# Patient Record
Sex: Male | Born: 1960 | Race: White | Hispanic: No | Marital: Single | Smoking: Never smoker
Health system: Southern US, Community
[De-identification: ages and names within clinical notes are randomized; demographics above are authoritative.]

## PROBLEM LIST (undated history)

## (undated) DIAGNOSIS — M1990 Unspecified osteoarthritis, unspecified site: Secondary | ICD-10-CM

## (undated) DIAGNOSIS — T4145XA Adverse effect of unspecified anesthetic, initial encounter: Secondary | ICD-10-CM

## (undated) DIAGNOSIS — F329 Major depressive disorder, single episode, unspecified: Secondary | ICD-10-CM

## (undated) DIAGNOSIS — I10 Essential (primary) hypertension: Secondary | ICD-10-CM

## (undated) DIAGNOSIS — G2581 Restless legs syndrome: Secondary | ICD-10-CM

## (undated) DIAGNOSIS — K219 Gastro-esophageal reflux disease without esophagitis: Secondary | ICD-10-CM

## (undated) DIAGNOSIS — Z9889 Other specified postprocedural states: Secondary | ICD-10-CM

## (undated) DIAGNOSIS — R569 Unspecified convulsions: Secondary | ICD-10-CM

## (undated) DIAGNOSIS — F410 Panic disorder [episodic paroxysmal anxiety] without agoraphobia: Secondary | ICD-10-CM

## (undated) DIAGNOSIS — E785 Hyperlipidemia, unspecified: Secondary | ICD-10-CM

## (undated) DIAGNOSIS — F32A Depression, unspecified: Secondary | ICD-10-CM

## (undated) DIAGNOSIS — R112 Nausea with vomiting, unspecified: Secondary | ICD-10-CM

## (undated) DIAGNOSIS — T8859XA Other complications of anesthesia, initial encounter: Secondary | ICD-10-CM

## (undated) DIAGNOSIS — R7989 Other specified abnormal findings of blood chemistry: Secondary | ICD-10-CM

## (undated) HISTORY — PX: HERNIA REPAIR: SHX51

## (undated) HISTORY — DX: Major depressive disorder, single episode, unspecified: F32.9

## (undated) HISTORY — DX: Hyperlipidemia, unspecified: E78.5

## (undated) HISTORY — PX: HIP PINNING: SHX1757

## (undated) HISTORY — DX: Other specified abnormal findings of blood chemistry: R79.89

## (undated) HISTORY — PX: JOINT REPLACEMENT: SHX530

## (undated) HISTORY — DX: Depression, unspecified: F32.A

## (undated) HISTORY — PX: COLONOSCOPY: SHX174

## (undated) HISTORY — DX: Essential (primary) hypertension: I10

## (undated) HISTORY — DX: Panic disorder (episodic paroxysmal anxiety): F41.0

## (undated) HISTORY — PX: TONSILLECTOMY: SUR1361

---

## 1998-02-18 HISTORY — PX: KNEE ARTHROSCOPY: SUR90

## 2004-02-19 HISTORY — PX: KNEE ARTHROSCOPY W/ ACL RECONSTRUCTION: SHX1858

## 2007-01-29 ENCOUNTER — Ambulatory Visit: Payer: Self-pay | Admitting: Unknown Physician Specialty

## 2007-08-03 ENCOUNTER — Ambulatory Visit: Payer: Self-pay | Admitting: Gastroenterology

## 2008-11-14 ENCOUNTER — Encounter: Admission: RE | Admit: 2008-11-14 | Discharge: 2008-11-14 | Payer: Self-pay | Admitting: Sports Medicine

## 2008-11-18 ENCOUNTER — Encounter: Admission: RE | Admit: 2008-11-18 | Discharge: 2008-11-18 | Payer: Self-pay | Admitting: Sports Medicine

## 2009-01-27 ENCOUNTER — Encounter: Admission: RE | Admit: 2009-01-27 | Discharge: 2009-01-27 | Payer: Self-pay | Admitting: Sports Medicine

## 2009-10-03 ENCOUNTER — Ambulatory Visit: Payer: Self-pay | Admitting: Internal Medicine

## 2010-09-28 ENCOUNTER — Encounter (HOSPITAL_COMMUNITY)
Admission: RE | Admit: 2010-09-28 | Discharge: 2010-09-28 | Disposition: A | Discharge status: Home / Self Care | Payer: BC Managed Care – PPO | Source: Ambulatory Visit | Attending: Orthopedic Surgery | Admitting: Orthopedic Surgery

## 2010-09-28 ENCOUNTER — Other Ambulatory Visit (HOSPITAL_COMMUNITY): Payer: Self-pay | Admitting: Orthopedic Surgery

## 2010-09-28 DIAGNOSIS — M1990 Unspecified osteoarthritis, unspecified site: Secondary | ICD-10-CM

## 2010-09-28 LAB — URINALYSIS, ROUTINE W REFLEX MICROSCOPIC
Glucose, UA: NEGATIVE mg/dL
Hgb urine dipstick: NEGATIVE
Protein, ur: NEGATIVE mg/dL
Urobilinogen, UA: 0.2 mg/dL (ref 0.0–1.0)
pH: 5 (ref 5.0–8.0)

## 2010-09-28 LAB — COMPREHENSIVE METABOLIC PANEL
ALT: 42 U/L (ref 0–53)
Albumin: 4.4 g/dL (ref 3.5–5.2)
Alkaline Phosphatase: 104 U/L (ref 39–117)
Calcium: 10.8 mg/dL — ABNORMAL HIGH (ref 8.4–10.5)
GFR calc Af Amer: >60 mL/min (ref >60)

## 2010-09-28 LAB — APTT: aPTT: 30 seconds (ref 24–37)

## 2010-09-28 LAB — CBC
HCT: 42.5 % (ref 39.0–52.0)
MCH: 30.6 pg (ref 26.0–34.0)
MCV: 85.7 fL (ref 78.0–100.0)
Platelets: 268 10*3/uL (ref 150–400)
RBC: 4.96 MIL/uL (ref 4.22–5.81)
RDW: 12.2 % (ref 11.5–15.5)

## 2010-09-28 LAB — SURGICAL PCR SCREEN
MRSA, PCR: NEGATIVE
Staphylococcus aureus: NEGATIVE

## 2010-10-03 ENCOUNTER — Inpatient Hospital Stay (HOSPITAL_COMMUNITY): Payer: BC Managed Care – PPO

## 2010-10-03 ENCOUNTER — Inpatient Hospital Stay (HOSPITAL_COMMUNITY)
Admission: RE | Admit: 2010-10-03 | Discharge: 2010-10-06 | DRG: 818 | Disposition: A | Discharge status: Home / Self Care | Payer: BC Managed Care – PPO | Source: Ambulatory Visit | Attending: Orthopedic Surgery | Admitting: Orthopedic Surgery

## 2010-10-03 DIAGNOSIS — E785 Hyperlipidemia, unspecified: Secondary | ICD-10-CM | POA: Diagnosis present

## 2010-10-03 DIAGNOSIS — K560 Paralytic ileus: Secondary | ICD-10-CM | POA: Diagnosis not present

## 2010-10-03 DIAGNOSIS — E119 Type 2 diabetes mellitus without complications: Secondary | ICD-10-CM | POA: Diagnosis present

## 2010-10-03 DIAGNOSIS — K219 Gastro-esophageal reflux disease without esophagitis: Secondary | ICD-10-CM | POA: Diagnosis present

## 2010-10-03 DIAGNOSIS — K929 Disease of digestive system, unspecified: Secondary | ICD-10-CM | POA: Diagnosis not present

## 2010-10-03 DIAGNOSIS — K429 Umbilical hernia without obstruction or gangrene: Secondary | ICD-10-CM | POA: Diagnosis present

## 2010-10-03 DIAGNOSIS — M1610 Unilateral primary osteoarthritis, unspecified hip: Principal | ICD-10-CM | POA: Diagnosis present

## 2010-10-03 DIAGNOSIS — I10 Essential (primary) hypertension: Secondary | ICD-10-CM | POA: Diagnosis present

## 2010-10-03 DIAGNOSIS — Z01812 Encounter for preprocedural laboratory examination: Secondary | ICD-10-CM

## 2010-10-03 DIAGNOSIS — D62 Acute posthemorrhagic anemia: Secondary | ICD-10-CM | POA: Diagnosis not present

## 2010-10-03 DIAGNOSIS — M51379 Other intervertebral disc degeneration, lumbosacral region without mention of lumbar back pain or lower extremity pain: Secondary | ICD-10-CM | POA: Diagnosis present

## 2010-10-03 DIAGNOSIS — Y831 Surgical operation with implant of artificial internal device as the cause of abnormal reaction of the patient, or of later complication, without mention of misadventure at the time of the procedure: Secondary | ICD-10-CM | POA: Diagnosis not present

## 2010-10-03 DIAGNOSIS — F411 Generalized anxiety disorder: Secondary | ICD-10-CM | POA: Diagnosis present

## 2010-10-03 DIAGNOSIS — M169 Osteoarthritis of hip, unspecified: Principal | ICD-10-CM | POA: Diagnosis present

## 2010-10-03 DIAGNOSIS — E871 Hypo-osmolality and hyponatremia: Secondary | ICD-10-CM | POA: Diagnosis not present

## 2010-10-03 DIAGNOSIS — Z01818 Encounter for other preprocedural examination: Secondary | ICD-10-CM

## 2010-10-03 DIAGNOSIS — M5137 Other intervertebral disc degeneration, lumbosacral region: Secondary | ICD-10-CM | POA: Diagnosis present

## 2010-10-03 DIAGNOSIS — E669 Obesity, unspecified: Secondary | ICD-10-CM | POA: Diagnosis present

## 2010-10-03 HISTORY — PX: TOTAL HIP ARTHROPLASTY: SHX124

## 2010-10-03 LAB — ABO/RH: ABO/RH(D): A POS

## 2010-10-03 LAB — GLUCOSE, CAPILLARY
Glucose-Capillary: 180 mg/dL — ABNORMAL HIGH (ref 70–99)
Glucose-Capillary: 208 mg/dL — ABNORMAL HIGH (ref 70–99)
Glucose-Capillary: 229 mg/dL — ABNORMAL HIGH (ref 70–99)

## 2010-10-04 LAB — BASIC METABOLIC PANEL
BUN: 14 mg/dL (ref 6–23)
CO2: 26 mEq/L (ref 19–32)
CO2: 26 mEq/L (ref 19–32)
Calcium: 8.6 mg/dL (ref 8.4–10.5)
Calcium: 8.6 mg/dL (ref 8.4–10.5)
Chloride: 94 mEq/L — ABNORMAL LOW (ref 96–112)
Creatinine, Ser: 0.87 mg/dL (ref 0.50–1.35)
Glucose, Bld: 212 mg/dL — ABNORMAL HIGH (ref 70–99)
Potassium: 4.1 mEq/L (ref 3.5–5.1)
Potassium: 4.1 mEq/L (ref 3.5–5.1)

## 2010-10-04 LAB — CBC
HCT: 34.4 % — ABNORMAL LOW (ref 39.0–52.0)
Platelets: 219 10*3/uL (ref 150–400)
RDW: 12.2 % (ref 11.5–15.5)

## 2010-10-04 LAB — PROTIME-INR
INR: 1.09 (ref 0.00–1.49)
Prothrombin Time: 14.3 seconds (ref 11.6–15.2)

## 2010-10-04 LAB — GLUCOSE, CAPILLARY

## 2010-10-05 ENCOUNTER — Inpatient Hospital Stay (HOSPITAL_COMMUNITY): Payer: BC Managed Care – PPO

## 2010-10-05 LAB — BASIC METABOLIC PANEL
CO2: 31 mEq/L (ref 19–32)
Calcium: 8.9 mg/dL (ref 8.4–10.5)
GFR calc non Af Amer: >60 mL/min (ref >60)
Glucose, Bld: 198 mg/dL — ABNORMAL HIGH (ref 70–99)
Potassium: 3.8 mEq/L (ref 3.5–5.1)
Sodium: 130 mEq/L — ABNORMAL LOW (ref 135–145)

## 2010-10-05 LAB — GLUCOSE, CAPILLARY
Glucose-Capillary: 202 mg/dL — ABNORMAL HIGH (ref 70–99)
Glucose-Capillary: 93 mg/dL (ref 70–99)
Glucose-Capillary: 125 mg/dL — ABNORMAL HIGH (ref 70–99)

## 2010-10-05 LAB — VITAMIN B12: Vitamin B-12: 323 pg/mL (ref 211–911)

## 2010-10-05 LAB — CBC
Hemoglobin: 10.6 g/dL — ABNORMAL LOW (ref 13.0–17.0)
MCH: 29.8 pg (ref 26.0–34.0)
MCHC: 35 g/dL (ref 30.0–36.0)
Platelets: 190 10*3/uL (ref 150–400)

## 2010-10-05 LAB — CREATININE, URINE, RANDOM: Creatinine, Urine: 137.04 mg/dL

## 2010-10-05 LAB — IRON AND TIBC
Saturation Ratios: 6 % — ABNORMAL LOW (ref 20–55)
UIBC: 216 ug/dL

## 2010-10-05 LAB — PROTIME-INR: INR: 1.33 (ref 0.00–1.49)

## 2010-10-05 LAB — FERRITIN: Ferritin: 496 ng/mL — ABNORMAL HIGH (ref 22–322)

## 2010-10-05 LAB — OSMOLALITY: Osmolality: 265 mOsm/kg — ABNORMAL LOW (ref 275–300)

## 2010-10-06 ENCOUNTER — Inpatient Hospital Stay (HOSPITAL_COMMUNITY): Payer: BC Managed Care – PPO

## 2010-10-06 LAB — BASIC METABOLIC PANEL
CO2: 25 mEq/L (ref 19–32)
Calcium: 8.4 mg/dL (ref 8.4–10.5)
Chloride: 96 mEq/L (ref 96–112)
Potassium: 4 mEq/L (ref 3.5–5.1)
Sodium: 131 mEq/L — ABNORMAL LOW (ref 135–145)

## 2010-10-06 LAB — DIFFERENTIAL
Basophils Absolute: 0.1 10*3/uL (ref 0.0–0.1)
Lymphocytes Relative: 17 % (ref 12–46)
Lymphs Abs: 1.2 10*3/uL (ref 0.7–4.0)
Neutro Abs: 5.2 10*3/uL (ref 1.7–7.7)

## 2010-10-06 LAB — CBC
HCT: 28.7 % — ABNORMAL LOW (ref 39.0–52.0)
Hemoglobin: 10 g/dL — ABNORMAL LOW (ref 13.0–17.0)
RBC: 3.39 MIL/uL — ABNORMAL LOW (ref 4.22–5.81)
WBC: 7.2 10*3/uL (ref 4.0–10.5)

## 2010-10-06 LAB — HEMOGLOBIN A1C
Hgb A1c MFr Bld: 7.4 % — ABNORMAL HIGH (ref <5.7)
Mean Plasma Glucose: 166 mg/dL — ABNORMAL HIGH (ref <117)

## 2010-10-06 LAB — GLUCOSE, CAPILLARY: Glucose-Capillary: 82 mg/dL (ref 70–99)

## 2010-10-06 LAB — MAGNESIUM: Magnesium: 2.3 mg/dL (ref 1.5–2.5)

## 2010-10-06 LAB — PROTIME-INR: INR: 1.44 (ref 0.00–1.49)

## 2010-10-08 LAB — GLUCOSE, CAPILLARY: Glucose-Capillary: 279 mg/dL — ABNORMAL HIGH (ref 70–99)

## 2010-10-11 NOTE — Op Note (Signed)
NAMEASHELY, JOSHUA NO.:  [id number]  MEDICAL RECORD NO.:  [id number]  LOCATION:                                 FACILITY:  PHYSICIAN:  Loreta Ave, M.D. 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  Status post pinning and pin removal with residual deformity.  POSTOPERATIVE DIAGNOSES:  End-stage degenerative arthritis, left hip. Previous diagnosis of slipped capital femoral epiphysis.  Status post pinning and pin removal with residual deformity.  PROCEDURE:  Conversion of left hip to total hip replacement utilizing Stryker secure fit plus prosthesis.  A 56-mm acetabular component screw fixation x2.  A 40 mm internal diameter ceramic liner.  A Press-Fit #10 femoral component secure fit plus 127 degrees neck angle.  A 40-mm Biolox lock head -2.5 mm.  SURGEON:  Loreta Ave, MD  ASSISTANT:  Zonia Kief, PA present throughout the entire case and necessary for timely completion of procedure.  ANESTHESIA:  General.  BLOOD LOSS:  400 mL.  BLOOD GIVEN:  None.  SPECIMENS:  None.  CULTURES:  None.  COMPLICATIONS:  None.  DRESSING:  Soft compressive with abduction pillow.  PROCEDURE:  The patient was brought to the operating room and after adequate anesthesia had been obtained, turned to a lateral position. Central obesity as well as obesity around the pelvis made positioning and supporting more difficult.  Able to get him to securely position and a lateral position but it took a fair amount of pressure from the upright support to either side.  Prepped and draped in usual sterile fashion.  Incision along the shaft of femur extending posterosuperior. Ignoring the more anterior previous incision.  Skin and subcutaneous tissues divided.   The iliotibial band exposed, incised.  Charnley retractor put in place.  Moderate amount of scarring freed up. Neurovascular structures identified, protected.  External rotator and capsule taken down off the back of the intertrochanteric groove.  Hip dislocated posteriorly.  Marked flattening with inferior and posterior displacement from his previous slipped epiphysis.  Femoral neck cut one fingerbreadth above the lesser trochanter.  Acetabulum exposed.  Grade IV changes.  Remnants of labrum.  Debris cleared out.  Brought to good bleeding bone.  Fitted with a 56-mm cup.  A 45 degrees of abduction, 20 degrees of anteversion.  Good capturing and fixation, augmented with 2 screws, pre drilled through the cup.  A 40-mm internal diameter polyethylene liner inserted.  Attention turned to the femur.  Distal reamers, proximal broaches brought up to good sizing fitting for #10 component which was firmly seated after appropriate trials.  With the 40 mm Biolox lock head, restoration of anteversion as much as possible and utilizing the head with a 2.5 mm shortening, I had a nice congruent reduction.  Equal leg lengths.  Good stability in flexion/extension. Wound irrigated.  External rotator and capsule which had been tagged with FiberWire were repaired with the back of the intertrochanteric groove through drill holes tied over bony bridge.  Wound irrigated.  Charnley retractor removed.  Iliotibial band closed with Vicryl.  Skin and subcutaneous tissue with Vicryl and staples.  Margins were injected with Marcaine.  Sterile compressive dressing applied.  Returned to supine position.  Abduction pillow placed.  Anesthesia reversed. Brought to recovery room.  Tolerated surgery well.  No complications.     Loreta Ave, M.D.     DFM/MEDQ  D:  10/04/2010  T:  10/04/2010  Job:  [id number]  Electronically Signed by Mckinley Jewel M.D. on 10/11/2010 04:15:10 PM

## 2010-10-25 NOTE — Consult Note (Signed)
Donald Velez, Donald Velez NO.:  [id number]  MEDICAL RECORD NO.:  [id number]  LOCATION:  5041                         FACILITY:  [facility]  PHYSICIAN:  Ramiro Harvest, MD    DATE OF BIRTH:  Apr 04, 1960  DATE OF CONSULTATION:  10/05/2010 DATE OF DISCHARGE:                                CONSULTATION   PRIMARY CARE PHYSICIAN:  Bethann Punches, MD in [place] at the [facility].  REQUESTING CONSULT:  Loreta Ave, M.D.  REASON FOR CONSULTATION:  Colonic ileus.  HISTORY OF PRESENT ILLNESS:  Donald Velez is a 50 year old Caucasian gentleman with history of type 2 diabetes, hypertension, depression, anxiety, end-stage degenerative joint disease of the left hip who was admitted to the orthopedic service on October 03, 2010 for left total hip replacement.  The patient did fine postop and was started on a morphine PCA pump.  Since admission, the patient has not had any bowel movements, developed some abdominal bloating.  X-rays which were done was consistent with a colonic ileus.  We were called to consult for further evaluation and management.  The patient denies any fever, no chills.  No nausea. No vomiting.  No diarrhea.  No constipation.  No chest pain.  No shortness of breath.  No cough.  No weakness.  The patient however does endorse some abdominal soreness with some bloating.  The patient states he has passed some flatus today and is tolerating p.o.'s  ALLERGIES:  No known drug allergies.  PAST MEDICAL HISTORY: 1. Type 2 diabetes greater than 1 year. 2. Hypertension. 3. Depression. 4. Anxiety. 5. End-stage degenerative joint disease of the left hip. 6. Umbilical hernia. 7. Gastroesophageal reflux disease. 8. Questionable hyperlipidemia. 9. Lumbar degenerative disk disease.  HOME MEDICATIONS:  The patient was on prior to admission include, 1. Cymbalta 60 mg p.o. daily. 2. Xanax 1 mg p.o. q.a.m. 3. Lisinopril and hydrochlorothiazide 10/12.5 two tablets  p.o. daily. 4. Metformin 500 mg p.o. b.i.d. 5. Glipizide 10 mg p.o. daily  CURRENT MEDICATIONS IN-HOUSE: 1. Xanax 1 mg p.o. daily. 2. Colace 100 mg p.o. b.i.d. 3. Cymbalta 60 mg p.o. daily. 4. Lovenox 30 mg subcu q.12 h. 5. Glipizide 10 mg p.o. daily. 6. Hydrochlorothiazide 25 mg p.o. daily. 7. Sliding scale insulin. 8. Lisinopril 20 mg p.o. daily. 9. Glucophage 500 mg p.o. b.i.d. 10.Protonix 80 mg p.o. daily. 11.Coumadin per pharmacy.  SOCIAL HISTORY:  The patient denies any tobacco use.  No alcohol use. No IV drug use.  The patient is single.  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  The patient has no children and the patient is single.  FAMILY HISTORY:  Mother alive at age 29, healthy.  Father deceased at age 35 from prostate cancer.  REVIEW OF SYSTEMS:  As per HPI, otherwise negative.  PHYSICAL EXAMINATION:  VITAL SIGNS:  Temperature 98.1, pulse of 104, blood pressure 94/59, respirations 16, satting 92% on room air.  CBG is 93-232. GENERAL:  The patient is well-developed, well-nourished gentleman in no acute cardiopulmonary distress. HEENT:  Normocephalic, atraumatic.  Pupils are equal, round, reactive to light and accommodation.  Extraocular movements are intact.  Oropharynx is clear.  No lesions.  No exudates. NECK:  Supple.  No lymphadenopathy.  Dry mucous membranes. RESPIRATORY:  Lungs are clear to auscultation bilaterally.  No wheezes. No crackles.  No rhonchi. CARDIOVASCULAR:  Regular rate and rhythm.  No murmurs, rubs, or gallops. ABDOMEN:  Distended, slightly tight, positive bowel sounds.  Nontender to palpation. EXTREMITIES:  No clubbing, cyanosis, or edema.  LABORATORY DATA:  BMET, sodium 130, potassium 3.8, chloride 93, bicarb 31, glucose 198, BUN 13, creatinine 0.95, calcium of 8.9, PT of 16.7, INR 1.33.  CBC with a white count of 9.4, hemoglobin 10.6, hematocrit 30.3, and a platelet count of 190.  Abdominal series shows a  colonic ileus.  ASSESSMENT AND PLAN:  Mr. Donald Velez is a 50 year old gentleman with history of hypertension, diabetes, gastroesophageal reflux disease, questionable hyperlipidemia, end-stage degenerative joint disease of the left hip who was admitted on October 03, 2010 secondary to failed conservative measures for left total hip replacement and found to have a colonic ileus postop.  PROBLEM: 1. Colonic ileus questionable etiology likely narcotic induced as the     patient was on a morphine PCA pump postsurgery in the setting of     dehydration.  We will place the patient on clear liquid diet.  We     will check a magnesium level.  We will follow up his electrolytes,     agree with Colace twice daily.  We will recommend Dulcolax once     daily, try to keep magnesium greater than 2, try to keep potassium     between 4.5 and 5.  Hydrate with IV fluids, serial x-rays,     mobilize, and minimize narcotics.  We will follow with you. 2. Borderline blood pressure.  We will hold BP medications for now, IV     fluids. 3. Hyponatremia likely secondary to volume depletion.  Check a FENA,     check a urine and serum osmolality, hydrate with IV fluids and     follow. 4. Type 2 diabetes, check a hemoglobin A1c.  CBGs have ranged from 93-     232.  Recommend holding oral hypoglycemics while inhouse and     continue sliding scale insulin. 5. Dehydration, IV fluids. 6. Anemia, likely secondary to acute blood loss anemia secondary to     recent surgery.  Check an anemia panel, follow H and H. 7. Status post left total hip replacement per primary team. 8. Depression/anxiety.  Continue Cymbalta and Xanax. 9. Hypertension.  Hold blood pressure medications secondary to     borderline blood pressure.  It has been a pleasure taking care of Mr. Donald Velez.     Ramiro Harvest, MD     DT/MEDQ  D:  10/05/2010  T:  10/05/2010  Job:  [id number]  cc:   Bethann Punches, MD Loreta Ave,  M.D.  Electronically Signed by Ramiro Harvest MD on 10/25/2010 12:22:53 PM

## 2011-09-13 ENCOUNTER — Encounter: Payer: Self-pay | Admitting: Gastroenterology

## 2011-11-05 ENCOUNTER — Encounter: Payer: BC Managed Care – PPO | Admitting: Gastroenterology

## 2011-11-25 ENCOUNTER — Telehealth: Payer: Self-pay | Admitting: *Deleted

## 2011-11-25 ENCOUNTER — Ambulatory Visit (AMBULATORY_SURGERY_CENTER): Payer: BC Managed Care – PPO | Admitting: *Deleted

## 2011-11-25 VITALS — Ht 74.0 in | Wt 301.4 lb

## 2011-11-25 DIAGNOSIS — Z1211 Encounter for screening for malignant neoplasm of colon: Secondary | ICD-10-CM

## 2011-11-25 MED ORDER — MOVIPREP 100 G PO SOLR: ORAL | Status: DC

## 2011-11-25 NOTE — Progress Notes (Signed)
Patient states last colonoscopy was 2008 at [facility] with Dr.Oracio Markham Jordan. Release of information signed and given to Amanda,CMA. Also notified John Nulty,CRNA that patient states he was told after last surgery on hip he was "hard to wake up".

## 2011-11-25 NOTE — Telephone Encounter (Signed)
Patient states last colonoscopy was 2008 with Dr.Raed Markham Jordan at [facility] in [place]. He states he received letter in mail to repeat colonoscopy in 3 years but he has not had colonoscopy since 2008. Patient thinks he had polyps but unsure. Release of information signed by patient and given to Amanda,CMA.

## 2011-11-26 ENCOUNTER — Telehealth: Payer: Self-pay | Admitting: Gastroenterology

## 2011-11-26 ENCOUNTER — Encounter: Payer: Self-pay | Admitting: Gastroenterology

## 2011-11-26 NOTE — Telephone Encounter (Signed)
Forward  3 pages from [facility] to Dr. Claudette Head for review on 11-26-11 ym

## 2011-12-02 NOTE — Telephone Encounter (Signed)
Per Dr. Russella Dar since we do not have path then we will presume it's a adenomatous colon polyp and proceed with Colonoscopy.

## 2011-12-02 NOTE — Telephone Encounter (Signed)
Dr. Markham Jordan did not ever scope this man according there office. The only Colonoscopy they have in there system is from [facility] and Dr. Servando Snare is the one who did the Colonoscopy. Called and spoke with medical record department at [facility] and they state for whatever reason they do not have a pathology from this Colonoscopy. I put the report on your desk to review but we do not have a path to go with this report.

## 2011-12-05 ENCOUNTER — Telehealth: Payer: Self-pay | Admitting: Gastroenterology

## 2011-12-05 NOTE — Telephone Encounter (Signed)
Pt states he was told at one time, "If you have a surgery, let them know that you had a hard time waking up."  Writing discusses with him Propofol and all his questions answered.

## 2011-12-09 ENCOUNTER — Encounter: Payer: Self-pay | Admitting: Gastroenterology

## 2011-12-09 ENCOUNTER — Ambulatory Visit (AMBULATORY_SURGERY_CENTER): Payer: BC Managed Care – PPO | Admitting: Gastroenterology

## 2011-12-09 VITALS — BP 113/83 | HR 71 | Temp 97.7°F | Resp 20 | Ht 74.0 in | Wt 301.0 lb

## 2011-12-09 DIAGNOSIS — D126 Benign neoplasm of colon, unspecified: Secondary | ICD-10-CM

## 2011-12-09 DIAGNOSIS — Z86010 Personal history of colonic polyps: Secondary | ICD-10-CM

## 2011-12-09 DIAGNOSIS — Z1211 Encounter for screening for malignant neoplasm of colon: Secondary | ICD-10-CM

## 2011-12-09 LAB — GLUCOSE, CAPILLARY
Glucose-Capillary: 173 mg/dL — ABNORMAL HIGH (ref 70–99)
Glucose-Capillary: 159 mg/dL — ABNORMAL HIGH (ref 70–99)

## 2011-12-09 MED ORDER — SODIUM CHLORIDE 0.9 % IV SOLN: 500.0000 mL | INTRAVENOUS | Status: DC

## 2011-12-09 NOTE — Progress Notes (Signed)
Pt states that he was told "something" after his hip replacement in 2012.  He thinks they mentioned that he was a difficult intubation.

## 2011-12-09 NOTE — Op Note (Signed)
[facility] [address] [place], [address]   COLONOSCOPY PROCEDURE REPORT  PATIENT: Donald Velez, Donald Velez  MR#: [id number] BIRTHDATE: 1960/05/16 , 51  yrs. old GENDER: Male ENDOSCOPIST: Meryl Dare, MD, [facility] REFERRED XB:JYNW Hyacinth Meeker, M.D. PROCEDURE DATE:  12/09/2011 PROCEDURE:   Colonoscopy with snare polypectomy and Colonoscopy with biopsy ASA CLASS:   Class II INDICATIONS:patient's personal history of adenomatous colon polyps, 2009. MEDICATIONS: MAC sedation, administered by CRNA and propofol (Diprivan) 300mg  IV DESCRIPTION OF PROCEDURE:   After the risks benefits and alternatives of the procedure were thoroughly explained, informed consent was obtained.  A digital rectal exam revealed no abnormalities of the rectum.   The LB CF-[id number]  endoscope was introduced through the anus and advanced to the cecum, which was identified by both the appendix and ileocecal valve. No adverse events experienced.   The quality of the prep was good, using MoviPrep  The instrument was then slowly withdrawn as the colon was fully examined.  COLON FINDINGS: A sessile polyp measuring 4 mm in size was found in the descending colon.  A polypectomy was performed with cold forceps.  The resection was complete and the polyp tissue was completely retrieved.   A sessile polyp measuring 5 mm in size was found in the sigmoid colon.  A polypectomy was performed with a cold snare. The resection was complete and the polyp tissue was completely retrieved. Moderate diverticulosis was noted in the sigmoid colon. The colon was otherwise normal. There was no diverticulosis, inflammation, polyps or cancers unless previously stated. Retroflexed views revealed small internal hemorrhoids. The time to cecum=1 minutes 42 seconds. Withdrawal time=9 minutes 55 seconds. The scope was withdrawn and the procedure completed. COMPLICATIONS: There were no complications.  ENDOSCOPIC IMPRESSION: 1.    Sessile polyp in the descending colon; polypectomy was performed with cold forceps 2.   Sessile polyp in the sigmoid colon; polypectomy was performed with a cold snare 3.   Moderate diverticulosis was noted in the sigmoid colon 4.   Small internal hemorrhoids  RECOMMENDATIONS: 1.  Await pathology results 2.  Repeat colonoscopy in 5 years 3.  High fiber diet with liberal fluid intake  eSigned:  Meryl Dare, MD, [facility] 12/09/2011 10:48 AM

## 2011-12-09 NOTE — Progress Notes (Signed)
Patient did not have preoperative order for IV antibiotic SSI prophylaxis. (G8918)  Patient did not experience any of the following events: a burn prior to discharge; a fall within the facility; wrong site/side/patient/procedure/implant event; or a hospital transfer or hospital admission upon discharge from the facility. (G8907)  

## 2011-12-09 NOTE — Patient Instructions (Signed)
Colon polyps x2 removed today. Also diverticulosis and hemorrhoids seen. Try to follow high fiber diet with liberal fluid intake. See handouts given. Repeat colonoscopy in 5 years. Resume current medications. Call us with any questions or concerns. Thank you!!  YOU HAD AN ENDOSCOPIC PROCEDURE TODAY AT THE [facility]: Refer to the procedure report that was given to you for any specific questions about what was found during the examination.  If the procedure report does not answer your questions, please call your gastroenterologist to clarify.  If you requested that your care partner not be given the details of your procedure findings, then the procedure report has been included in a sealed envelope for you to review at your convenience later.  YOU SHOULD EXPECT: Some feelings of bloating in the abdomen. Passage of more gas than usual.  Walking can help get rid of the air that was put into your GI tract during the procedure and reduce the bloating. If you had a lower endoscopy (such as a colonoscopy or flexible sigmoidoscopy) you may notice spotting of blood in your stool or on the toilet paper. If you underwent a bowel prep for your procedure, then you may not have a normal bowel movement for a few days.  DIET: Your first meal following the procedure should be a light meal and then it is ok to progress to your normal diet.  A half-sandwich or bowl of soup is an example of a good first meal.  Heavy or fried foods are harder to digest and may make you feel nauseous or bloated.  Likewise meals heavy in dairy and vegetables can cause extra gas to form and this can also increase the bloating.  Drink plenty of fluids but you should avoid alcoholic beverages for 24 hours.  ACTIVITY: Your care partner should take you home directly after the procedure.  You should plan to take it easy, moving slowly for the rest of the day.  You can resume normal activity the day after the procedure however you should  NOT DRIVE or use heavy machinery for 24 hours (because of the sedation medicines used during the test).    SYMPTOMS TO REPORT IMMEDIATELY: A gastroenterologist can be reached at any hour.  During normal business hours, 8:30 AM to 5:00 PM Monday through Friday, call [phone].  After hours and on weekends, please call the GI answering service at [phone] who will take a message and have the physician on call contact you.   Following lower endoscopy (colonoscopy or flexible sigmoidoscopy):  Excessive amounts of blood in the stool  Significant tenderness or worsening of abdominal pains  Swelling of the abdomen that is new, acute  Fever of 100F or higher  FOLLOW UP: If any biopsies were taken you will be contacted by phone or by letter within the next 1-3 weeks.  Call your gastroenterologist if you have not heard about the biopsies in 3 weeks.  Our staff will call the home number listed on your records the next business day following your procedure to check on you and address any questions or concerns that you may have at that time regarding the information given to you following your procedure. This is a courtesy call and so if there is no answer at the home number and we have not heard from you through the emergency physician on call, we will assume that you have returned to your regular daily activities without incident.  SIGNATURES/CONFIDENTIALITY: You and/or your care partner have signed  paperwork which will be entered into your electronic medical record.  These signatures attest to the fact that that the information above on your After Visit Summary has been reviewed and is understood.  Full responsibility of the confidentiality of this discharge information lies with you and/or your care-partner.

## 2011-12-10 ENCOUNTER — Telehealth: Payer: Self-pay | Admitting: *Deleted

## 2011-12-10 NOTE — Telephone Encounter (Signed)
  Follow up Call-  Call back number 12/09/2011  Post procedure Call Back phone  # [phone]  Permission to leave phone message Yes     Patient questions:  Do you have a fever, pain , or abdominal swelling? no Pain Score  0 *  Have you tolerated food without any problems? yes  Have you been able to return to your normal activities? yes  Do you have any questions about your discharge instructions: Diet   no Medications  no Follow up visit  no  Do you have questions or concerns about your Care? no  Actions: * If pain score is 4 or above: No action needed, pain <4.

## 2011-12-12 ENCOUNTER — Encounter: Payer: Self-pay | Admitting: Gastroenterology

## 2014-05-05 ENCOUNTER — Ambulatory Visit: Payer: Self-pay | Admitting: Surgery

## 2014-05-05 DIAGNOSIS — I10 Essential (primary) hypertension: Secondary | ICD-10-CM | POA: Diagnosis not present

## 2014-05-12 ENCOUNTER — Ambulatory Visit: Payer: Self-pay | Admitting: Surgery

## 2014-06-19 NOTE — Op Note (Signed)
PATIENT NAME:  Donald Velez, Donald Velez MR#:  [id number] DATE OF BIRTH:  05/21/1960  DATE OF PROCEDURE:  05/12/2014  PREOPERATIVE DIAGNOSIS: Umbilical hernia.   POSTOPERATIVE DIAGNOSIS: Umbilical hernia.   PROCEDURE: Umbilical hernia repair.   SURGEON: Loreli Dollar, MD  ANESTHESIA: General.   INDICATIONS: This 54 year old male has a history of bulging in the umbilicus. A large umbilical hernia was demonstrated. The bulge was situated in the upper and the right lateral aspect of the umbilicus. The bulge was approximately 6 cm in dimension. Repair was recommended for definitive treatment.   DESCRIPTION OF PROCEDURE: The patient was placed on the operating table in the supine position under general anesthesia. The abdomen was prepared with ChloraPrep and draped in a sterile manner.   A curvilinear incision was made across the upper aspect of the umbilicus extending from approximately 9 o'clock to 2 o'clock position, carried down through subcutaneous tissues to encounter a large umbilical hernia sac, which was dissected free from surrounding structures with blunt and sharp dissection and use of electrocautery. The sac was dissected away from the fascial ring defect and was inverted. The properitoneal fat was dissected away from the fascial ring defect extending back approximately 1 cm. The fascial ring defect itself was approximately 2 cm in dimension. A Bard soft mesh was cut out to create an oval shape of some 2 x 3 cm and was placed into the properitoneal plane and sutured to the fascia with through and through 0 Surgilon sutures with 4-point fixation. Next, the fascial ring defect was closed with a transversely oriented suture line of interrupted 0 Surgilon figure-of-eight sutures incorporating each suture into the mesh. The repair looked good. The subcutaneous tissues were infiltrated with 0.5% Sensorcaine with epinephrine. The skin of the umbilicus was sutured to the subcutaneous tissues with 3-0 chromic  and also inserted a 3-0 chromic pursestring suture to obliterate dead space. The skin was closed with running 4-0 Monocryl subcuticular suture and Dermabond. The patient appeared to tolerate the procedure satisfactorily and was then prepared for transfer to the recovery room.    ____________________________ Lenna Sciara. Rochel Brome, MD [email or web address]:bm D: 05/17/2014 17:14:38 ET T: 05/18/2014 05:37:38 ET JOB#: [id number]  cc: Loreli Dollar, MD, <Dictator> Loreli Dollar MD ELECTRONICALLY SIGNED 05/25/2014 16:25

## 2014-06-19 NOTE — Op Note (Signed)
PATIENT NAME:  Donald Velez, Donald Velez MR#:  [id number] DATE OF BIRTH:  1960/12/31  DATE OF PROCEDURE:  05/12/2014  PREOPERATIVE DIAGNOSIS: Umbilical hernia.   POSTOPERATIVE DIAGNOSIS: Umbilical hernia.   PROCEDURE: Umbilical hernia repair.   SURGEON: Rochel Brome, MD  ANESTHESIA: General.   INDICATIONS: This 54 year old male has history of bulging in the umbilicus. A large umbilical hernia was demonstrated. The bulge was situated in the upper and the right lateral aspect of the umbilicus. The bulge was approximately   DICTATION STOPS HERE   ____________________________ J. Rochel Brome, MD [email or web address]:sb D: 05/12/2014 08:54:27 ET T: 05/12/2014 14:23:11 ET JOB#: [id number]  cc: Loreli Dollar, MD, <Dictator>

## 2016-03-26 ENCOUNTER — Other Ambulatory Visit: Payer: Self-pay | Admitting: Otolaryngology

## 2016-03-26 DIAGNOSIS — K118 Other diseases of salivary glands: Secondary | ICD-10-CM

## 2016-03-29 ENCOUNTER — Ambulatory Visit
Admission: RE | Admit: 2016-03-29 | Discharge: 2016-03-29 | Disposition: A | Discharge status: Home / Self Care | Payer: BC Managed Care – PPO | Source: Ambulatory Visit | Attending: Otolaryngology | Admitting: Otolaryngology

## 2016-03-29 DIAGNOSIS — K118 Other diseases of salivary glands: Secondary | ICD-10-CM | POA: Diagnosis not present

## 2016-05-21 NOTE — H&P (Signed)
TOTAL HIP ADMISSION H&P  Patient is admitted for right total hip arthroplasty.  Subjective:  Chief Complaint: right hip pain  HPI: Donald Velez, 56 y.o. male, has a history of pain and functional disability in the right hip(s) due to arthritis and patient has failed non-surgical conservative treatments for greater than 12 weeks to include NSAID's and/or analgesics, corticosteriod injections and activity modification.  Onset of symptoms was abrupt starting 1 years ago with rapidlly worsening course since that time.The patient noted no past surgery on the right hip(s).  Patient currently rates pain in the right hip at 8 out of 10 with activity. Patient has night pain, worsening of pain with activity and weight bearing, trendelenberg gait, pain that interfers with activities of daily living and pain with passive range of motion. Patient has evidence of subchondral sclerosis and joint space narrowing by imaging studies. This condition presents safety issues increasing the risk of falls.  There is no current active infection.  There are no active problems to display for this patient.  Past Medical History:  Diagnosis Date  . Diabetes mellitus   . Hypertension   . Low testosterone   . Panic attacks     Past Surgical History:  Procedure Laterality Date  . COLONOSCOPY  2008   [facility]  . HIP PINNING  [id number]   left hip x2  . KNEE ARTHROSCOPY  2000   right  . KNEE ARTHROSCOPY W/ ACL RECONSTRUCTION  2006   right  . TONSILLECTOMY     as child  . TOTAL HIP ARTHROPLASTY  10-03-10   left    No prescriptions prior to admission.   No Known Allergies  Social History  Substance Use Topics  . Smoking status: Never Smoker  . Smokeless tobacco: Never Used  . Alcohol use No    Family History  Problem Relation Age of Onset  . Prostate cancer Father 4  . Colon cancer Neg Hx      Review of Systems  Constitutional: Negative.   HENT: Negative.   Eyes: Negative.    Respiratory: Negative.   Cardiovascular: Negative.   Gastrointestinal: Negative.   Genitourinary: Negative.   Musculoskeletal: Positive for joint pain.  Skin: Negative.   Neurological: Negative.   Endo/Heme/Allergies: Negative.   Psychiatric/Behavioral: Negative.     Objective:  Physical Exam  Constitutional: He is oriented to person, place, and time. He appears well-developed and well-nourished.  HENT:  Head: Normocephalic and atraumatic.  Eyes: EOM are normal. Pupils are equal, round, and reactive to light.  Neck: Normal range of motion. Neck supple.  Cardiovascular: Normal rate and regular rhythm.   Respiratory: Effort normal and breath sounds normal.  GI: Soft. Bowel sounds are normal.  Musculoskeletal:  Examination of his right hip reveals markedly positive log roll.  Negative straight leg raise.  He is neurovascularly intact distally.    Neurological: He is alert and oriented to person, place, and time.  Skin: Skin is warm and dry.  Psychiatric: He has a normal mood and affect. His behavior is normal. Judgment and thought content normal.    Vital signs in last 24 hours:    Labs:   Estimated body mass index is 38.65 kg/m as calculated from the following:   Height as of 12/09/11: 6\' 2"  (1.88 m).   Weight as of 12/09/11: 136.5 kg (301 lb).   Imaging Review Plain radiographs demonstrate severe degenerative joint disease of the right hip(s). The bone quality appears to be fair for  age and reported activity level.  Assessment/Plan:  End stage arthritis, right hip(s)  The patient history, physical examination, clinical judgement of the provider and imaging studies are consistent with end stage degenerative joint disease of the right hip(s) and total hip arthroplasty is deemed medically necessary. The treatment options including medical management, injection therapy, arthroscopy and arthroplasty were discussed at length. The risks and benefits of total hip arthroplasty  were presented and reviewed. The risks due to aseptic loosening, infection, stiffness, dislocation/subluxation,  thromboembolic complications and other imponderables were discussed.  The patient acknowledged the explanation, agreed to proceed with the plan and consent was signed. Patient is being admitted for inpatient treatment for surgery, pain control, PT, OT, prophylactic antibiotics, VTE prophylaxis, progressive ambulation and ADL's and discharge planning.The patient is planning to be discharged home with home health services

## 2016-05-22 NOTE — Pre-Procedure Instructions (Addendum)
    NICKHOLAS GOLDSTON  05/22/2016      [facility] - [place], [place] [address] Phone: [phone] Fax: [phone]    Your procedure is scheduled on  Wednesday, April 18th   Report to [facility] Admitting at  8:45 AM.             (posted surgery time 10:45 am - 1:22 pm).   Call this number if you have problems the MORNING of surgery:  [phone], otherwise call [phone] Mon - Fri  8-4:30 AM)   Remember:  Do not eat food or drink liquids after midnight Tuesday.   Take these medicines the morning of surgery with A SIP OF WATER : Xanax, Cymbalta.              4-5 days prior to surgery, STOP taking any Vitamins, Herbal Supplements, Anti-inflammatories.              DO NOT TAKE your diabetes medication the MORNING of surgery.   Do not wear jewelry - no rings or watches.  Do not wear lotions, colognes or deoderant.             Men may shave face and neck.   Do not bring valuables to the hospital.  [facility] is not responsible for any belongings or valuables.  Contacts, dentures or bridgework may not be worn into surgery.  Leave your suitcase in the car.  After surgery it may be brought to your room. For patients admitted to the hospital, discharge time will be determined by your treatment team.  Please read over the following fact sheets that you were given. Pain Booklet, MRSA Information and Surgical Site Infection Prevention

## 2016-05-23 ENCOUNTER — Encounter (HOSPITAL_COMMUNITY)
Admission: RE | Admit: 2016-05-23 | Discharge: 2016-05-23 | Disposition: A | Discharge status: Home / Self Care | Payer: BC Managed Care – PPO | Source: Ambulatory Visit | Attending: Orthopedic Surgery | Admitting: Orthopedic Surgery

## 2016-05-23 ENCOUNTER — Encounter (HOSPITAL_COMMUNITY): Payer: Self-pay

## 2016-05-23 DIAGNOSIS — Z01818 Encounter for other preprocedural examination: Secondary | ICD-10-CM | POA: Diagnosis not present

## 2016-05-23 DIAGNOSIS — Z01812 Encounter for preprocedural laboratory examination: Secondary | ICD-10-CM | POA: Insufficient documentation

## 2016-05-23 DIAGNOSIS — Z0183 Encounter for blood typing: Secondary | ICD-10-CM | POA: Insufficient documentation

## 2016-05-23 DIAGNOSIS — M1611 Unilateral primary osteoarthritis, right hip: Secondary | ICD-10-CM | POA: Diagnosis not present

## 2016-05-23 DIAGNOSIS — R9431 Abnormal electrocardiogram [ECG] [EKG]: Secondary | ICD-10-CM | POA: Diagnosis not present

## 2016-05-23 HISTORY — DX: Unspecified convulsions: R56.9

## 2016-05-23 HISTORY — DX: Unspecified osteoarthritis, unspecified site: M19.90

## 2016-05-23 HISTORY — DX: Nausea with vomiting, unspecified: R11.2

## 2016-05-23 HISTORY — DX: Other complications of anesthesia, initial encounter: T88.59XA

## 2016-05-23 HISTORY — DX: Restless legs syndrome: G25.81

## 2016-05-23 HISTORY — DX: Adverse effect of unspecified anesthetic, initial encounter: T41.45XA

## 2016-05-23 HISTORY — DX: Gastro-esophageal reflux disease without esophagitis: K21.9

## 2016-05-23 HISTORY — DX: Other specified postprocedural states: Z98.890

## 2016-05-23 LAB — COMPREHENSIVE METABOLIC PANEL
ALK PHOS: 99 U/L (ref 38–126)
ALT: 24 U/L (ref 17–63)
AST: 25 U/L (ref 15–41)
Albumin: 3.9 g/dL (ref 3.5–5.0)
Anion gap: 12 (ref 5–15)
BUN: 14 mg/dL (ref 6–20)
CALCIUM: 9.6 mg/dL (ref 8.9–10.3)
CO2: 23 mmol/L (ref 22–32)
CREATININE: 0.89 mg/dL (ref 0.61–1.24)
Chloride: 100 mmol/L — ABNORMAL LOW (ref 101–111)
Glucose, Bld: 216 mg/dL — ABNORMAL HIGH (ref 65–99)
Potassium: 3.9 mmol/L (ref 3.5–5.1)
Sodium: 135 mmol/L (ref 135–145)
Total Bilirubin: 0.7 mg/dL (ref 0.3–1.2)
Total Protein: 7.1 g/dL (ref 6.5–8.1)

## 2016-05-23 LAB — GLUCOSE, CAPILLARY: Glucose-Capillary: 211 mg/dL — ABNORMAL HIGH (ref 65–99)

## 2016-05-23 LAB — CBC
HEMATOCRIT: 41.8 % (ref 39.0–52.0)
HEMOGLOBIN: 14.5 g/dL (ref 13.0–17.0)
MCH: 29.8 pg (ref 26.0–34.0)
MCHC: 34.7 g/dL (ref 30.0–36.0)
MCV: 86 fL (ref 78.0–100.0)
Platelets: 261 10*3/uL (ref 150–400)
RBC: 4.86 MIL/uL (ref 4.22–5.81)
RDW: 12.3 % (ref 11.5–15.5)
WBC: 6.3 10*3/uL (ref 4.0–10.5)

## 2016-05-23 LAB — TYPE AND SCREEN
ABO/RH(D): A POS
ANTIBODY SCREEN: NEGATIVE

## 2016-05-23 LAB — SURGICAL PCR SCREEN
MRSA, PCR: NEGATIVE
Staphylococcus aureus: NEGATIVE

## 2016-05-23 NOTE — Progress Notes (Signed)
PCP is Dr. Loleta Chance from [facility] (have requested any and all labs he currently just had)  Had stress test 10-15 yrs ago, was for football physical.  Denies cardiac issues at the present. He does not check his sugar at all.  Just takes his medication.  Per patient, A1C in 01/2016 was 7.2 He did say that with his 2006 ACL reconstruction he had a hard time waking up.  No problems tho with his last 2 surgeries Sleep study done yrs ago, but he was never call with the results.

## 2016-05-24 ENCOUNTER — Other Ambulatory Visit (HOSPITAL_COMMUNITY): Payer: BC Managed Care – PPO

## 2016-05-24 LAB — HEMOGLOBIN A1C
Hgb A1c MFr Bld: 7.4 % — ABNORMAL HIGH (ref 4.8–5.6)
Mean Plasma Glucose: 166 mg/dL

## 2016-06-04 MED ORDER — TRANEXAMIC ACID 1000 MG/10ML IV SOLN
1000.0000 mg | INTRAVENOUS | Status: AC
  Administered 2016-06-05: 1000 mg via INTRAVENOUS
  Filled 2016-06-04: qty 10

## 2016-06-04 MED ORDER — DEXTROSE 5 % IV SOLN
3.0000 g | INTRAVENOUS | Status: AC
  Administered 2016-06-05: 3 g via INTRAVENOUS
  Filled 2016-06-04: qty 3000

## 2016-06-05 ENCOUNTER — Inpatient Hospital Stay (HOSPITAL_COMMUNITY)
Admission: RE | Admit: 2016-06-05 | Discharge: 2016-06-06 | DRG: 470 | Disposition: A | Discharge status: Home Health | Payer: BC Managed Care – PPO | Source: Ambulatory Visit | Attending: Orthopedic Surgery | Admitting: Orthopedic Surgery

## 2016-06-05 ENCOUNTER — Inpatient Hospital Stay (HOSPITAL_COMMUNITY): Payer: BC Managed Care – PPO

## 2016-06-05 ENCOUNTER — Inpatient Hospital Stay (HOSPITAL_COMMUNITY): Payer: BC Managed Care – PPO | Admitting: Emergency Medicine

## 2016-06-05 ENCOUNTER — Encounter (HOSPITAL_COMMUNITY)
Admission: RE | Disposition: A | Discharge status: Home Health | Payer: Self-pay | Source: Ambulatory Visit | Attending: Orthopedic Surgery

## 2016-06-05 ENCOUNTER — Encounter (HOSPITAL_COMMUNITY): Payer: Self-pay | Admitting: *Deleted

## 2016-06-05 DIAGNOSIS — Z79899 Other long term (current) drug therapy: Secondary | ICD-10-CM

## 2016-06-05 DIAGNOSIS — Z96642 Presence of left artificial hip joint: Secondary | ICD-10-CM | POA: Diagnosis present

## 2016-06-05 DIAGNOSIS — Z7982 Long term (current) use of aspirin: Secondary | ICD-10-CM | POA: Diagnosis not present

## 2016-06-05 DIAGNOSIS — K219 Gastro-esophageal reflux disease without esophagitis: Secondary | ICD-10-CM | POA: Diagnosis present

## 2016-06-05 DIAGNOSIS — M1611 Unilateral primary osteoarthritis, right hip: Secondary | ICD-10-CM | POA: Diagnosis present

## 2016-06-05 DIAGNOSIS — I10 Essential (primary) hypertension: Secondary | ICD-10-CM | POA: Diagnosis present

## 2016-06-05 DIAGNOSIS — Z6838 Body mass index (BMI) 38.0-38.9, adult: Secondary | ICD-10-CM

## 2016-06-05 DIAGNOSIS — E119 Type 2 diabetes mellitus without complications: Secondary | ICD-10-CM | POA: Diagnosis present

## 2016-06-05 DIAGNOSIS — D62 Acute posthemorrhagic anemia: Secondary | ICD-10-CM | POA: Diagnosis not present

## 2016-06-05 DIAGNOSIS — Z419 Encounter for procedure for purposes other than remedying health state, unspecified: Secondary | ICD-10-CM

## 2016-06-05 HISTORY — PX: TOTAL HIP ARTHROPLASTY: SHX124

## 2016-06-05 LAB — GLUCOSE, CAPILLARY
GLUCOSE-CAPILLARY: 183 mg/dL — AB (ref 65–99)
Glucose-Capillary: 218 mg/dL — ABNORMAL HIGH (ref 65–99)
Glucose-Capillary: 226 mg/dL — ABNORMAL HIGH (ref 65–99)

## 2016-06-05 SURGERY — ARTHROPLASTY, HIP, TOTAL, ANTERIOR APPROACH
Anesthesia: General | Site: Hip | Laterality: Right

## 2016-06-05 MED ORDER — HYDROMORPHONE HCL 1 MG/ML IJ SOLN
0.2500 mg | INTRAMUSCULAR | Status: DC | PRN
  Administered 2016-06-05 (×2): 0.5 mg via INTRAVENOUS

## 2016-06-05 MED ORDER — ASPIRIN EC 325 MG PO TBEC: 325.0000 mg | DELAYED_RELEASE_TABLET | Freq: Every day | ORAL | 0 refills | Status: DC

## 2016-06-05 MED ORDER — METHOCARBAMOL 1000 MG/10ML IJ SOLN
500.0000 mg | Freq: Four times a day (QID) | INTRAVENOUS | Status: DC | PRN
  Filled 2016-06-05: qty 5

## 2016-06-05 MED ORDER — MIDAZOLAM HCL 5 MG/5ML IJ SOLN
INTRAMUSCULAR | Status: DC | PRN
  Administered 2016-06-05: 2 mg via INTRAVENOUS

## 2016-06-05 MED ORDER — CEFAZOLIN SODIUM-DEXTROSE 2-4 GM/100ML-% IV SOLN
2.0000 g | Freq: Four times a day (QID) | INTRAVENOUS | Status: AC
  Administered 2016-06-05: 2 g via INTRAVENOUS
  Filled 2016-06-05 (×2): qty 100

## 2016-06-05 MED ORDER — ZOLPIDEM TARTRATE 5 MG PO TABS: 5.0000 mg | ORAL_TABLET | Freq: Every evening | ORAL | Status: DC | PRN

## 2016-06-05 MED ORDER — ALBUMIN HUMAN 5 % IV SOLN
INTRAVENOUS | Status: DC | PRN
  Administered 2016-06-05 (×2): via INTRAVENOUS

## 2016-06-05 MED ORDER — OXYCODONE-ACETAMINOPHEN 5-325 MG PO TABS: 1.0000 | ORAL_TABLET | ORAL | 0 refills | Status: DC | PRN

## 2016-06-05 MED ORDER — ONDANSETRON HCL 4 MG/2ML IJ SOLN
INTRAMUSCULAR | Status: AC
  Filled 2016-06-05: qty 2

## 2016-06-05 MED ORDER — LACTATED RINGERS IV SOLN: INTRAVENOUS | Status: DC

## 2016-06-05 MED ORDER — POLYETHYLENE GLYCOL 3350 17 G PO PACK: 17.0000 g | PACK | Freq: Every day | ORAL | Status: DC | PRN

## 2016-06-05 MED ORDER — FENTANYL CITRATE (PF) 250 MCG/5ML IJ SOLN
INTRAMUSCULAR | Status: AC
  Filled 2016-06-05: qty 5

## 2016-06-05 MED ORDER — HYDROMORPHONE HCL 1 MG/ML IJ SOLN
0.5000 mg | INTRAMUSCULAR | Status: DC | PRN
  Administered 2016-06-05: 1 mg via INTRAVENOUS
  Filled 2016-06-05: qty 1

## 2016-06-05 MED ORDER — PROPOFOL 10 MG/ML IV BOLUS
INTRAVENOUS | Status: AC
Start: 2016-06-05 — End: 2016-06-05
  Filled 2016-06-05: qty 20

## 2016-06-05 MED ORDER — SUGAMMADEX SODIUM 200 MG/2ML IV SOLN
INTRAVENOUS | Status: DC | PRN
  Administered 2016-06-05: 200 mg via INTRAVENOUS

## 2016-06-05 MED ORDER — HYDROMORPHONE HCL 1 MG/ML IJ SOLN
INTRAMUSCULAR | Status: AC
  Filled 2016-06-05: qty 0.5

## 2016-06-05 MED ORDER — FENTANYL CITRATE (PF) 100 MCG/2ML IJ SOLN
INTRAMUSCULAR | Status: DC | PRN
  Administered 2016-06-05 (×2): 50 ug via INTRAVENOUS
  Administered 2016-06-05 (×2): 25 ug via INTRAVENOUS
  Administered 2016-06-05: 100 ug via INTRAVENOUS
  Administered 2016-06-05: 25 ug via INTRAVENOUS
  Administered 2016-06-05: 50 ug via INTRAVENOUS
  Administered 2016-06-05: 25 ug via INTRAVENOUS
  Administered 2016-06-05: 150 ug via INTRAVENOUS

## 2016-06-05 MED ORDER — MENTHOL 3 MG MT LOZG: 1.0000 | LOZENGE | OROMUCOSAL | Status: DC | PRN

## 2016-06-05 MED ORDER — METHOCARBAMOL 500 MG PO TABS
ORAL_TABLET | ORAL | Status: AC
  Administered 2016-06-05: 500 mg
  Filled 2016-06-05: qty 1

## 2016-06-05 MED ORDER — OXYCODONE HCL 5 MG PO TABS
5.0000 mg | ORAL_TABLET | ORAL | Status: DC | PRN
  Administered 2016-06-05 – 2016-06-06 (×5): 10 mg via ORAL
  Filled 2016-06-05 (×4): qty 2

## 2016-06-05 MED ORDER — CHLORHEXIDINE GLUCONATE 4 % EX LIQD: 60.0000 mL | Freq: Once | CUTANEOUS | Status: DC

## 2016-06-05 MED ORDER — PHENOL 1.4 % MT LIQD: 1.0000 | OROMUCOSAL | Status: DC | PRN

## 2016-06-05 MED ORDER — SODIUM CHLORIDE 0.9% FLUSH
INTRAVENOUS | Status: DC | PRN
  Administered 2016-06-05: 40 mL

## 2016-06-05 MED ORDER — ONDANSETRON HCL 4 MG/2ML IJ SOLN
INTRAMUSCULAR | Status: DC | PRN
  Administered 2016-06-05: 4 mg via INTRAVENOUS

## 2016-06-05 MED ORDER — ALUM & MAG HYDROXIDE-SIMETH 200-200-20 MG/5ML PO SUSP: 30.0000 mL | ORAL | Status: DC | PRN

## 2016-06-05 MED ORDER — METFORMIN HCL 500 MG PO TABS
500.0000 mg | ORAL_TABLET | Freq: Two times a day (BID) | ORAL | Status: DC
  Administered 2016-06-05 – 2016-06-06 (×2): 500 mg via ORAL
  Filled 2016-06-05 (×2): qty 1

## 2016-06-05 MED ORDER — ACETAMINOPHEN 325 MG PO TABS: 650.0000 mg | ORAL_TABLET | Freq: Four times a day (QID) | ORAL | Status: DC | PRN

## 2016-06-05 MED ORDER — MIDAZOLAM HCL 2 MG/2ML IJ SOLN: 0.5000 mg | Freq: Once | INTRAMUSCULAR | Status: DC | PRN

## 2016-06-05 MED ORDER — MAGNESIUM CITRATE PO SOLN: 1.0000 | Freq: Once | ORAL | Status: DC | PRN

## 2016-06-05 MED ORDER — LIDOCAINE 2% (20 MG/ML) 5 ML SYRINGE
INTRAMUSCULAR | Status: AC
  Filled 2016-06-05: qty 5

## 2016-06-05 MED ORDER — OXYCODONE HCL 5 MG PO TABS
ORAL_TABLET | ORAL | Status: AC
  Filled 2016-06-05: qty 2

## 2016-06-05 MED ORDER — SIMVASTATIN 20 MG PO TABS
20.0000 mg | ORAL_TABLET | Freq: Every day | ORAL | Status: DC
  Administered 2016-06-05: 20 mg via ORAL
  Filled 2016-06-05: qty 1

## 2016-06-05 MED ORDER — PROMETHAZINE HCL 25 MG/ML IJ SOLN: 6.2500 mg | INTRAMUSCULAR | Status: DC | PRN

## 2016-06-05 MED ORDER — PROPOFOL 10 MG/ML IV BOLUS
INTRAVENOUS | Status: AC
  Filled 2016-06-05: qty 20

## 2016-06-05 MED ORDER — ONDANSETRON HCL 4 MG/2ML IJ SOLN: 4.0000 mg | Freq: Four times a day (QID) | INTRAMUSCULAR | Status: DC | PRN

## 2016-06-05 MED ORDER — DIPHENHYDRAMINE HCL 12.5 MG/5ML PO ELIX: 12.5000 mg | ORAL_SOLUTION | ORAL | Status: DC | PRN

## 2016-06-05 MED ORDER — EPHEDRINE SULFATE 50 MG/ML IJ SOLN
INTRAMUSCULAR | Status: DC | PRN
  Administered 2016-06-05 (×2): 5 mg via INTRAVENOUS

## 2016-06-05 MED ORDER — 0.9 % SODIUM CHLORIDE (POUR BTL) OPTIME
TOPICAL | Status: DC | PRN
  Administered 2016-06-05: 1000 mL

## 2016-06-05 MED ORDER — LISINOPRIL-HYDROCHLOROTHIAZIDE 20-25 MG PO TABS: 1.0000 | ORAL_TABLET | Freq: Every day | ORAL | Status: DC

## 2016-06-05 MED ORDER — BUPIVACAINE LIPOSOME 1.3 % IJ SUSP
20.0000 mL | Freq: Once | INTRAMUSCULAR | Status: AC
  Administered 2016-06-05: 20 mL
  Filled 2016-06-05: qty 20

## 2016-06-05 MED ORDER — INSULIN ASPART 100 UNIT/ML ~~LOC~~ SOLN
0.0000 [IU] | Freq: Three times a day (TID) | SUBCUTANEOUS | Status: DC
  Administered 2016-06-06 (×2): 3 [IU] via SUBCUTANEOUS

## 2016-06-05 MED ORDER — ONDANSETRON HCL 4 MG PO TABS: 4.0000 mg | ORAL_TABLET | Freq: Three times a day (TID) | ORAL | 0 refills | Status: DC | PRN

## 2016-06-05 MED ORDER — METHOCARBAMOL 500 MG PO TABS
500.0000 mg | ORAL_TABLET | Freq: Four times a day (QID) | ORAL | Status: DC | PRN
  Administered 2016-06-05 – 2016-06-06 (×3): 500 mg via ORAL
  Filled 2016-06-05 (×3): qty 1

## 2016-06-05 MED ORDER — MIDAZOLAM HCL 2 MG/2ML IJ SOLN
INTRAMUSCULAR | Status: AC
  Filled 2016-06-05: qty 2

## 2016-06-05 MED ORDER — BUPIVACAINE LIPOSOME 1.3 % IJ SUSP
20.0000 mL | Freq: Once | INTRAMUSCULAR | Status: DC
  Filled 2016-06-05: qty 20

## 2016-06-05 MED ORDER — PROPOFOL 10 MG/ML IV BOLUS
INTRAVENOUS | Status: DC | PRN
  Administered 2016-06-05: 200 mg via INTRAVENOUS

## 2016-06-05 MED ORDER — SUGAMMADEX SODIUM 200 MG/2ML IV SOLN
INTRAVENOUS | Status: AC
  Filled 2016-06-05: qty 2

## 2016-06-05 MED ORDER — POTASSIUM CHLORIDE IN NACL 20-0.9 MEQ/L-% IV SOLN
INTRAVENOUS | Status: DC
  Administered 2016-06-05: 22:00:00 via INTRAVENOUS
  Filled 2016-06-05: qty 1000

## 2016-06-05 MED ORDER — DULOXETINE HCL 60 MG PO CPEP
60.0000 mg | ORAL_CAPSULE | Freq: Every day | ORAL | Status: DC
  Administered 2016-06-06: 60 mg via ORAL
  Filled 2016-06-05: qty 1

## 2016-06-05 MED ORDER — BUPIVACAINE HCL (PF) 0.5 % IJ SOLN
INTRAMUSCULAR | Status: DC | PRN
  Administered 2016-06-05: 30 mL

## 2016-06-05 MED ORDER — LISINOPRIL 20 MG PO TABS
20.0000 mg | ORAL_TABLET | Freq: Every day | ORAL | Status: DC
  Administered 2016-06-05 – 2016-06-06 (×2): 20 mg via ORAL
  Filled 2016-06-05 (×2): qty 1

## 2016-06-05 MED ORDER — DOCUSATE SODIUM 100 MG PO CAPS
100.0000 mg | ORAL_CAPSULE | Freq: Two times a day (BID) | ORAL | Status: DC
  Administered 2016-06-05 – 2016-06-06 (×3): 100 mg via ORAL
  Filled 2016-06-05 (×3): qty 1

## 2016-06-05 MED ORDER — GLIMEPIRIDE 4 MG PO TABS
2.0000 mg | ORAL_TABLET | Freq: Two times a day (BID) | ORAL | Status: DC
  Administered 2016-06-05 – 2016-06-06 (×2): 2 mg via ORAL
  Filled 2016-06-05 (×2): qty 1

## 2016-06-05 MED ORDER — MEPERIDINE HCL 25 MG/ML IJ SOLN: 6.2500 mg | INTRAMUSCULAR | Status: DC | PRN

## 2016-06-05 MED ORDER — ALPRAZOLAM 0.5 MG PO TABS: 0.5000 mg | ORAL_TABLET | Freq: Every evening | ORAL | Status: DC | PRN

## 2016-06-05 MED ORDER — BISACODYL 5 MG PO TBEC: 5.0000 mg | DELAYED_RELEASE_TABLET | Freq: Every day | ORAL | Status: DC | PRN

## 2016-06-05 MED ORDER — ROCURONIUM BROMIDE 100 MG/10ML IV SOLN
INTRAVENOUS | Status: DC | PRN
  Administered 2016-06-05: 20 mg via INTRAVENOUS
  Administered 2016-06-05: 50 mg via INTRAVENOUS

## 2016-06-05 MED ORDER — ASPIRIN EC 325 MG PO TBEC
325.0000 mg | DELAYED_RELEASE_TABLET | Freq: Every day | ORAL | Status: DC
  Administered 2016-06-06: 325 mg via ORAL
  Filled 2016-06-05: qty 1

## 2016-06-05 MED ORDER — ACETAMINOPHEN 650 MG RE SUPP: 650.0000 mg | Freq: Four times a day (QID) | RECTAL | Status: DC | PRN

## 2016-06-05 MED ORDER — HYDROCHLOROTHIAZIDE 25 MG PO TABS
25.0000 mg | ORAL_TABLET | Freq: Every day | ORAL | Status: DC
  Administered 2016-06-05 – 2016-06-06 (×2): 25 mg via ORAL
  Filled 2016-06-05 (×2): qty 1

## 2016-06-05 MED ORDER — METOCLOPRAMIDE HCL 5 MG/ML IJ SOLN: 5.0000 mg | Freq: Three times a day (TID) | INTRAMUSCULAR | Status: DC | PRN

## 2016-06-05 MED ORDER — ONDANSETRON HCL 4 MG PO TABS: 4.0000 mg | ORAL_TABLET | Freq: Four times a day (QID) | ORAL | Status: DC | PRN

## 2016-06-05 MED ORDER — METOCLOPRAMIDE HCL 5 MG PO TABS: 5.0000 mg | ORAL_TABLET | Freq: Three times a day (TID) | ORAL | Status: DC | PRN

## 2016-06-05 MED ORDER — LIDOCAINE HCL (CARDIAC) 20 MG/ML IV SOLN
INTRAVENOUS | Status: DC | PRN
  Administered 2016-06-05: 20 mg via INTRAVENOUS

## 2016-06-05 MED ORDER — LACTATED RINGERS IV SOLN
INTRAVENOUS | Status: DC
  Administered 2016-06-05 (×2): via INTRAVENOUS

## 2016-06-05 MED ORDER — ROCURONIUM BROMIDE 50 MG/5ML IV SOSY
PREFILLED_SYRINGE | INTRAVENOUS | Status: AC
  Filled 2016-06-05: qty 10

## 2016-06-05 SURGICAL SUPPLY — 62 items
BLADE CLIPPER SURG (BLADE) IMPLANT
BLADE SAW SGTL 18X1.27X75 (BLADE) ×2 IMPLANT
BLADE SAW SGTL 18X1.27X75MM (BLADE) ×1
CAPT HIP TOTAL 2 ×3 IMPLANT
CELLS DAT CNTRL 66122 CELL SVR (MISCELLANEOUS) ×1 IMPLANT
CLOSURE STERI-STRIP 1/2X4 (GAUZE/BANDAGES/DRESSINGS) ×2
CLSR STERI-STRIP ANTIMIC 1/2X4 (GAUZE/BANDAGES/DRESSINGS) ×4 IMPLANT
COVER SURGICAL LIGHT HANDLE (MISCELLANEOUS) ×3 IMPLANT
DRAPE C-ARM 42X72 X-RAY (DRAPES) ×3 IMPLANT
DRAPE HALF SHEET 40X57 (DRAPES) ×3 IMPLANT
DRAPE IMP U-DRAPE 54X76 (DRAPES) ×9 IMPLANT
DRAPE INCISE IOBAN 66X45 STRL (DRAPES) ×3 IMPLANT
DRAPE ORTHO SPLIT 77X108 STRL (DRAPES) ×4
DRAPE STERI IOBAN 125X83 (DRAPES) ×3 IMPLANT
DRAPE SURG 17X23 STRL (DRAPES) ×3 IMPLANT
DRAPE SURG ORHT 6 SPLT 77X108 (DRAPES) ×2 IMPLANT
DRAPE U-SHAPE 47X51 STRL (DRAPES) ×3 IMPLANT
DRSG AQUACEL AG ADV 3.5X10 (GAUZE/BANDAGES/DRESSINGS) ×3 IMPLANT
DRSG AQUACEL AG ADV 3.5X14 (GAUZE/BANDAGES/DRESSINGS) ×3 IMPLANT
DURAPREP 26ML APPLICATOR (WOUND CARE) ×3 IMPLANT
ELECT BLADE 4.0 EZ CLEAN MEGAD (MISCELLANEOUS) ×3
ELECT CAUTERY BLADE 6.4 (BLADE) ×3 IMPLANT
ELECT REM PT RETURN 9FT ADLT (ELECTROSURGICAL) ×3
ELECTRODE BLDE 4.0 EZ CLN MEGD (MISCELLANEOUS) ×1 IMPLANT
ELECTRODE REM PT RTRN 9FT ADLT (ELECTROSURGICAL) ×1 IMPLANT
FACESHIELD WRAPAROUND (MASK) ×6 IMPLANT
GLOVE BIOGEL PI IND STRL 7.0 (GLOVE) ×2 IMPLANT
GLOVE BIOGEL PI INDICATOR 7.0 (GLOVE) ×4
GLOVE ECLIPSE 7.0 STRL STRAW (GLOVE) ×6 IMPLANT
GLOVE ORTHO TXT STRL SZ7.5 (GLOVE) ×6 IMPLANT
GOWN STRL REUS W/ TWL LRG LVL3 (GOWN DISPOSABLE) ×2 IMPLANT
GOWN STRL REUS W/ TWL XL LVL3 (GOWN DISPOSABLE) ×2 IMPLANT
GOWN STRL REUS W/TWL LRG LVL3 (GOWN DISPOSABLE) ×4
GOWN STRL REUS W/TWL XL LVL3 (GOWN DISPOSABLE) ×4
HEAD CERAMIC FEMORAL 36MM (Head) IMPLANT
KIT BASIN OR (CUSTOM PROCEDURE TRAY) ×3 IMPLANT
KIT ROOM TURNOVER OR (KITS) ×3 IMPLANT
MANIFOLD NEPTUNE II (INSTRUMENTS) ×3 IMPLANT
NDL SAFETY ECLIPSE 18X1.5 (NEEDLE) ×1 IMPLANT
NEEDLE HYPO 18GX1.5 SHARP (NEEDLE) ×2
NEEDLE HYPO 25GX1X1/2 BEV (NEEDLE) ×3 IMPLANT
NS IRRIG 1000ML POUR BTL (IV SOLUTION) ×3 IMPLANT
PACK TOTAL JOINT (CUSTOM PROCEDURE TRAY) ×3 IMPLANT
PAD ARMBOARD 7.5X6 YLW CONV (MISCELLANEOUS) ×6 IMPLANT
RTRCTR WOUND ALEXIS 18CM MED (MISCELLANEOUS) ×3
SUCTION FRAZIER HANDLE 10FR (MISCELLANEOUS) ×2
SUCTION TUBE FRAZIER 10FR DISP (MISCELLANEOUS) ×1 IMPLANT
SUT ETHIBOND NAB CT1 #1 30IN (SUTURE) IMPLANT
SUT MNCRL AB 4-0 PS2 18 (SUTURE) ×3 IMPLANT
SUT VIC AB 0 CT1 27 (SUTURE) ×2
SUT VIC AB 0 CT1 27XBRD ANBCTR (SUTURE) ×1 IMPLANT
SUT VIC AB 1 CT1 27 (SUTURE) ×2
SUT VIC AB 1 CT1 27XBRD ANBCTR (SUTURE) ×1 IMPLANT
SUT VIC AB 2-0 CT1 27 (SUTURE) ×4
SUT VIC AB 2-0 CT1 TAPERPNT 27 (SUTURE) ×2 IMPLANT
SYR 50ML LL SCALE MARK (SYRINGE) ×3 IMPLANT
SYR CONTROL 10ML LL (SYRINGE) ×3 IMPLANT
TOWEL OR 17X24 6PK STRL BLUE (TOWEL DISPOSABLE) ×3 IMPLANT
TOWEL OR 17X26 10 PK STRL BLUE (TOWEL DISPOSABLE) ×3 IMPLANT
TRAY CATH 16FR W/PLASTIC CATH (SET/KITS/TRAYS/PACK) IMPLANT
TRAY FOLEY CATH SILVER 14FR (SET/KITS/TRAYS/PACK) IMPLANT
WATER STERILE IRR 1000ML POUR (IV SOLUTION) ×3 IMPLANT

## 2016-06-05 NOTE — Anesthesia Procedure Notes (Signed)
Procedure Name: Intubation Date/Time: 06/05/2016 10:23 AM Performed by: Shirlyn Goltz Pre-anesthesia Checklist: Patient identified, Emergency Drugs available, Suction available and Patient being monitored Patient Re-evaluated:Patient Re-evaluated prior to inductionOxygen Delivery Method: Circle system utilized Preoxygenation: Pre-oxygenation with 100% oxygen Intubation Type: IV induction Ventilation: Mask ventilation without difficulty, Oral airway inserted - appropriate to patient size and Two handed mask ventilation required Laryngoscope Size: Glidescope and 4 Grade View: Grade I Tube type: Oral Tube size: 7.5 mm Number of attempts: 1 Airway Equipment and Method: Rigid stylet Placement Confirmation: ETT inserted through vocal cords under direct vision,  positive ETCO2 and breath sounds checked- equal and bilateral Secured at: 24 cm Tube secured with: Tape Dental Injury: Teeth and Oropharynx as per pre-operative assessment

## 2016-06-05 NOTE — Interval H&P Note (Signed)
History and Physical Interval Note:  06/05/2016 9:26 AM  Donald Velez  has presented today for surgery, with the diagnosis of DJD RIGHT HIP  The various methods of treatment have been discussed with the patient and family. After consideration of risks, benefits and other options for treatment, the patient has consented to  Procedure(s): TOTAL HIP ARTHROPLASTY ANTERIOR APPROACH (Right) as a surgical intervention .  The patient's history has been reviewed, patient examined, no change in status, stable for surgery.  I have reviewed the patient's chart and labs.  Questions were answered to the patient's satisfaction.     [name]

## 2016-06-05 NOTE — Evaluation (Signed)
Physical Therapy Evaluation Patient Details Name: Donald Velez MRN: [id number] DOB: 1960-09-23 Today's Date: 06/05/2016   History of Present Illness  Pt is 56 y/o male s/p elective R THA. PMH includes HTN, anxiety, DM, L THA, and  R knee arthroscopy.   Clinical Impression  Pt s/p elective surgery above with deficits below. PTA, pt was independent with functional mobility. Upon evaluation, pt limited by post op pain and weakness and required min to min guard assist for all mobility tasks. Pt reporting his family will be available to assist pt as needed upon return home. D/c recommendations below to increase functional mobility independence. Will continue to follow to progress mobility.     Follow Up Recommendations Home health PT;Supervision/Assistance - 24 hour    Equipment Recommendations  Rolling walker with 5" wheels;3in1 (PT)    Recommendations for Other Services       Precautions / Restrictions Precautions Precautions: Anterior Hip Precaution Booklet Issued: Yes (comment) Precaution Comments: Precautions reviewed and supine ther ex performed.  Restrictions Weight Bearing Restrictions: Yes RLE Weight Bearing: Weight bearing as tolerated      Mobility  Bed Mobility Overal bed mobility: Needs Assistance Bed Mobility: Supine to Sit     Supine to sit: Min assist;HOB elevated     General bed mobility comments: Min A for RLE management. Pt relied heavily on bed rails during mobility.   Transfers Overall transfer level: Needs assistance Equipment used: Rolling walker (2 wheeled) Transfers: Sit to/from Stand Sit to Stand: Min assist;From elevated surface         General transfer comment: Min A for steadying once standing. Verbal cues for appropriate UE placement prior to transfer. Elevated surface with cues to power through LE.   Ambulation/Gait Ambulation/Gait assistance: Min guard Ambulation Distance (Feet): 25 Feet Assistive device: Rolling walker (2  wheeled) Gait Pattern/deviations: Step-to pattern;Decreased step length - right;Decreased weight shift to right;Antalgic Gait velocity: Decreased Gait velocity interpretation: Below normal speed for age/gender General Gait Details: Slow, antalgic gait secondary to post op pain and weakness. Verbal cues for LE sequencing during ambulation. Min guard for steadying.   Stairs            Wheelchair Mobility    Modified Rankin (Stroke Patients Only)       Balance Overall balance assessment: Needs assistance Sitting-balance support: No upper extremity supported;Feet supported Sitting balance-Leahy Scale: Good     Standing balance support: Bilateral upper extremity supported;During functional activity Standing balance-Leahy Scale: Poor Standing balance comment: Reliant on RW for steadying                              Pertinent Vitals/Pain Pain Assessment: 0-10 Pain Score: 4  Pain Location: R hip Pain Descriptors / Indicators: Aching;Operative site guarding Pain Intervention(s): Limited activity within patient's tolerance;Monitored during session;Repositioned    Home Living Family/patient expects to be discharged to:: Private residence Living Arrangements: Alone (family lives beside of each other ) Available Help at Discharge: Family;Available 24 hours/day Type of Home: House Home Access: Stairs to enter Entrance Stairs-Rails: None Entrance Stairs-Number of Steps: 1 Home Layout: One level Home Equipment: None      Prior Function Level of Independence: Independent         Comments: Independent with no AD. Still working as PE Engineer, technical sales [employer]   Dominant Hand: Right    Extremity/Trunk Assessment   Upper Extremity Assessment Upper Extremity Assessment: Overall  WFL for tasks assessed    Lower Extremity Assessment Lower Extremity Assessment: RLE deficits/detail RLE Deficits / Details: Sensory in tact. Deficits consistent with postop pain  and weakness. Pt required assist for heel slides secondary to post op pain/weakness.     Cervical / Trunk Assessment Cervical / Trunk Assessment: Normal  Communication   Communication: No difficulties  Cognition Arousal/Alertness: Awake/alert Behavior During Therapy: WFL for tasks assessed/performed Overall Cognitive Status: Within Functional Limits for tasks assessed                                        General Comments General comments (skin integrity, edema, etc.): Pt's mother and brother present throughout session.     Exercises Total Joint Exercises Ankle Circles/Pumps: AROM;Both;10 reps;Supine Quad Sets: AROM;Right;10 reps;Supine Heel Slides: AAROM;Right;10 reps;Supine (partial range )   Assessment/Plan    PT Assessment Patient needs continued PT services  PT Problem List Decreased strength;Decreased range of motion;Decreased activity tolerance;Decreased balance;Decreased coordination;Decreased mobility;Decreased knowledge of use of DME;Decreased knowledge of precautions;Pain       PT Treatment Interventions DME instruction;Gait training;Functional mobility training;Stair training;Therapeutic activities;Therapeutic exercise;Balance training;Neuromuscular re-education;Patient/family education    PT Goals (Current goals can be found in the Care Plan section)  Acute Rehab PT Goals Patient Stated Goal: to return home  PT Goal Formulation: With patient Time For Goal Achievement: 06/12/16 Potential to Achieve Goals: Good    Frequency 7X/week   Barriers to discharge        Co-evaluation               End of Session Equipment Utilized During Treatment: Gait belt Activity Tolerance: Patient tolerated treatment well Patient left: in chair;with call bell/phone within reach Nurse Communication: Mobility status PT Visit Diagnosis: Other abnormalities of gait and mobility (R26.89);Pain Pain - Right/Left: Right Pain - part of body: Hip    Time:  6256-3893 PT Time Calculation (min) (ACUTE ONLY): 25 min   Charges:   PT Evaluation $PT Eval Low Complexity: 1 Procedure PT Treatments $Gait Training: 8-22 mins   PT G Codes:        Nicky Pugh, PT, DPT  Acute Rehabilitation Services  Pager: [phone]   Army Melia 06/05/2016, 5:09 PM

## 2016-06-05 NOTE — Anesthesia Postprocedure Evaluation (Signed)
Anesthesia Post Note  Patient: Donald Velez  Procedure(s) Performed: Procedure(s) (LRB): TOTAL HIP ARTHROPLASTY ANTERIOR APPROACH (Right)  Patient location during evaluation: PACU Anesthesia Type: General Level of consciousness: awake and alert Pain management: pain level controlled Vital Signs Assessment: post-procedure vital signs reviewed and stable Respiratory status: spontaneous breathing, nonlabored ventilation, respiratory function stable and patient connected to nasal cannula oxygen Cardiovascular status: blood pressure returned to baseline and stable Postop Assessment: no signs of nausea or vomiting Anesthetic complications: no       Last Vitals:  Vitals:   06/05/16 1415 06/05/16 1445  BP: 118/84 126/87  Pulse: 88 89  Resp:    Temp: 36.4 C 36.8 C    Last Pain:  Vitals:   06/05/16 1445  TempSrc: Oral  PainSc: 9                  Tiajuana Amass

## 2016-06-05 NOTE — Discharge Instructions (Signed)

## 2016-06-05 NOTE — Discharge Summary (Addendum)
Patient ID: Donald Velez MRN: [id number] DOB/AGE: December 17, 1960 56 y.o.  Admit date: 06/05/2016 Discharge date: 06/06/2016  Admission Diagnoses:  Principal Problem:   Primary localized osteoarthritis of right hip   Discharge Diagnoses:  Same  Past Medical History:  Diagnosis Date  . Arthritis   . Complication of anesthesia    ACL repair 2006  hard time waking up.  late 2 surgeries, no problems  . Diabetes mellitus    dx 2010  type 2  . GERD (gastroesophageal reflux disease)    occasional  . Hypertension   . Low testosterone   . Panic attacks   . PONV (postoperative nausea and vomiting)    1 time back in the 1980's  . RLS (restless legs syndrome)    dx 2004  . Seizures ([place])    happened while in high school.   Not on any meds    Surgeries: Procedure(s): TOTAL HIP ARTHROPLASTY ANTERIOR APPROACH on 06/05/2016   Consultants:   Discharged Condition: Improved  Hospital Course: Donald Velez is an 56 y.o. male who was admitted 06/05/2016 for operative treatment ofPrimary localized osteoarthritis of right hip. Patient has severe unremitting pain that affects sleep, daily activities, and work/hobbies. After pre-op clearance the patient was taken to the operating room on 06/05/2016 and underwent  Procedure(s): TOTAL HIP ARTHROPLASTY ANTERIOR APPROACH.    Patient was given perioperative antibiotics:  Anti-infectives    Start     Dose/Rate Route Frequency Ordered Stop   06/05/16 1615  ceFAZolin (ANCEF) IVPB 2g/100 mL premix     2 g 200 mL/hr over 30 Minutes Intravenous Every 6 hours 06/05/16 1455 06/05/16 2250   06/05/16 1030  ceFAZolin (ANCEF) 3 g in dextrose 5 % 50 mL IVPB     3 g 130 mL/hr over 30 Minutes Intravenous On call to O.R. 06/04/16 0907 06/05/16 1026       Patient was given sequential compression devices, early ambulation, and chemoprophylaxis to prevent DVT.  Patient benefited maximally from hospital stay and there were no complications.    Recent vital signs:   Patient Vitals for the past 24 hrs:  BP Temp Temp src Pulse Resp SpO2 Weight  06/06/16 0512 131/68 99.8 F (37.7 C) Oral 96 15 97 % -  06/05/16 2001 129/75 98 F (36.7 C) Oral 98 15 96 % -  06/05/16 1445 126/87 98.2 F (36.8 C) Oral 89 - 96 % -  06/05/16 1415 118/84 97.6 F (36.4 C) - 88 - 100 % -  06/05/16 1400 121/84 - - 84 15 91 % -  06/05/16 1345 118/78 - - 91 16 94 % -  06/05/16 1315 119/74 98.4 F (36.9 C) - (!) 117 11 100 % -  06/05/16 1313 - - - (!) 110 15 100 % -  06/05/16 0906 122/84 97.9 F (36.6 C) Oral 81 20 98 % 122 kg (269 lb)     Recent laboratory studies:   Recent Labs  06/06/16 0520  WBC 8.6  HGB 10.6*  HCT 31.4*  PLT 261  NA 131*  K 4.0  CL 93*  CO2 25  BUN 14  CREATININE 1.07  GLUCOSE 231*  CALCIUM 8.9     Discharge Medications:   Allergies as of 06/06/2016      Reactions   No Known Allergies       Medication List    TAKE these medications   ALPRAZolam 0.5 MG tablet Commonly known as:  XANAX Take 1 tablet by mouth Daily.  aspirin EC 325 MG tablet Take 1 tablet (325 mg total) by mouth daily. 1 tab a day for the next 30 days to prevent blood clots   DULoxetine 60 MG capsule Commonly known as:  CYMBALTA Take 60 mg by mouth daily.   glimepiride 2 MG tablet Commonly known as:  AMARYL Take 1 tablet by mouth 2 (two) times daily.   lisinopril-hydrochlorothiazide 20-25 MG tablet Commonly known as:  PRINZIDE,ZESTORETIC Take 1 tablet by mouth daily.   metFORMIN 500 MG tablet Commonly known as:  GLUCOPHAGE Take 500 mg by mouth 2 (two) times daily with a meal.   ondansetron 4 MG tablet Commonly known as:  ZOFRAN Take 1 tablet (4 mg total) by mouth every 8 (eight) hours as needed for nausea or vomiting.   oxyCODONE-acetaminophen 5-325 MG tablet Commonly known as:  PERCOCET Take 1-2 tablets by mouth every 4 (four) hours as needed for severe pain.   simvastatin 20 MG tablet Commonly known as:  ZOCOR Take 20 mg by mouth daily.    Testosterone 20.25 MG/1.25GM (1.62%) Gel Place 1 Squirt onto the skin See admin instructions. Apply 1 pump to each shoulder daily       Diagnostic Studies: Dg C-arm 1-60 Min  Result Date: 06/05/2016 CLINICAL DATA:  Right hip replacement EXAM: DG C-ARM 61-120 MIN; OPERATIVE RIGHT HIP WITH PELVIS COMPARISON:  None. FLUOROSCOPY TIME:  Radiation Exposure Index (as provided by the fluoroscopic device): Not available If the device does not provide the exposure index: Fluoroscopy Time:  47 seconds Number of Acquired Images:  3 FINDINGS: Right hip replacement is noted. No acute bony abnormality is seen. No soft tissue changes are noted. IMPRESSION: Status post right hip replacement. Electronically Signed   By: Inez Catalina M.D.   On: 06/05/2016 13:25   Dg Hip Operative Unilat W Or W/o Pelvis Right  Result Date: 06/05/2016 CLINICAL DATA:  Right hip replacement EXAM: DG C-ARM 61-120 MIN; OPERATIVE RIGHT HIP WITH PELVIS COMPARISON:  None. FLUOROSCOPY TIME:  Radiation Exposure Index (as provided by the fluoroscopic device): Not available If the device does not provide the exposure index: Fluoroscopy Time:  47 seconds Number of Acquired Images:  3 FINDINGS: Right hip replacement is noted. No acute bony abnormality is seen. No soft tissue changes are noted. IMPRESSION: Status post right hip replacement. Electronically Signed   By: Inez Catalina M.D.   On: 06/05/2016 13:25    Disposition: 01-Home or Self Care    Follow-up Information    [name], MD. Schedule an appointment as soon as possible for a visit in 2 weeks.   Specialty:  Orthopedic Surgery Contact information: [address] [place] [address] [phone]            Signed: Fannie Knee 06/06/2016, 8:52 AM

## 2016-06-05 NOTE — Anesthesia Preprocedure Evaluation (Signed)
Anesthesia Evaluation  Patient identified by MRN, date of birth, ID band Patient awake    Reviewed: Allergy & Precautions, NPO status , Patient's Chart, lab work & pertinent test results  History of Anesthesia Complications (+) PROLONGED EMERGENCE  Airway Mallampati: III  TM Distance: >3 FB Neck ROM: Full    Dental  (+) Dental Advisory Given   Pulmonary neg pulmonary ROS,    breath sounds clear to auscultation       Cardiovascular hypertension, Pt. on medications (-) angina Rhythm:Regular Rate:Normal     Neuro/Psych Anxiety negative neurological ROS     GI/Hepatic Neg liver ROS, GERD  Controlled,  Endo/Other  diabetes (glu 183), Oral Hypoglycemic Agents  Renal/GU negative Renal ROS     Musculoskeletal  (+) Arthritis , Osteoarthritis,    Abdominal (+) + obese,   Peds  Hematology negative hematology ROS (+)   Anesthesia Other Findings   Reproductive/Obstetrics                             Anesthesia Physical Anesthesia Plan  ASA: III  Anesthesia Plan: General   Post-op Pain Management:    Induction: Intravenous  Airway Management Planned: Oral ETT  Additional Equipment:   Intra-op Plan:   Post-operative Plan: Extubation in OR  Informed Consent: I have reviewed the patients History and Physical, chart, labs and discussed the procedure including the risks, benefits and alternatives for the proposed anesthesia with the patient or authorized representative who has indicated his/her understanding and acceptance.   Dental advisory given  Plan Discussed with: CRNA and Surgeon  Anesthesia Plan Comments: (Plan routine monitors, GETA)        Anesthesia Quick Evaluation

## 2016-06-05 NOTE — Transfer of Care (Signed)
Immediate Anesthesia Transfer of Care Note  Patient: TADEN WITTER  Procedure(s) Performed: Procedure(s): TOTAL HIP ARTHROPLASTY ANTERIOR APPROACH (Right)  Patient Location: PACU  Anesthesia Type:General  Level of Consciousness: awake, alert , oriented and patient cooperative  Airway & Oxygen Therapy: Patient Spontanous Breathing and Patient connected to nasal cannula oxygen  Post-op Assessment: Report given to RN and Post -op Vital signs reviewed and stable  Post vital signs: Reviewed and stable  Last Vitals:   Vitals:   06/05/16 0906  BP: 122/84  Pulse: 81  Resp: 20  Temp: 36.6 C    Last Pain:  Vitals:   06/05/16 0906  TempSrc: Oral      Patients Stated Pain Goal: 6 (07/06/31 5825)  Complications: No apparent anesthesia complications

## 2016-06-06 ENCOUNTER — Encounter (HOSPITAL_COMMUNITY): Payer: Self-pay | Admitting: Orthopedic Surgery

## 2016-06-06 LAB — CBC
HCT: 31.4 % — ABNORMAL LOW (ref 39.0–52.0)
HEMOGLOBIN: 10.6 g/dL — AB (ref 13.0–17.0)
MCH: 28.9 pg (ref 26.0–34.0)
MCHC: 33.8 g/dL (ref 30.0–36.0)
MCV: 85.6 fL (ref 78.0–100.0)
PLATELETS: 261 10*3/uL (ref 150–400)
RBC: 3.67 MIL/uL — ABNORMAL LOW (ref 4.22–5.81)
RDW: 12.5 % (ref 11.5–15.5)
WBC: 8.6 10*3/uL (ref 4.0–10.5)

## 2016-06-06 LAB — BASIC METABOLIC PANEL
Anion gap: 13 (ref 5–15)
BUN: 14 mg/dL (ref 6–20)
CALCIUM: 8.9 mg/dL (ref 8.9–10.3)
CO2: 25 mmol/L (ref 22–32)
Chloride: 93 mmol/L — ABNORMAL LOW (ref 101–111)
Creatinine, Ser: 1.07 mg/dL (ref 0.61–1.24)
Glucose, Bld: 231 mg/dL — ABNORMAL HIGH (ref 65–99)
Potassium: 4 mmol/L (ref 3.5–5.1)
Sodium: 131 mmol/L — ABNORMAL LOW (ref 135–145)

## 2016-06-06 LAB — GLUCOSE, CAPILLARY
Glucose-Capillary: 243 mg/dL — ABNORMAL HIGH (ref 65–99)
Glucose-Capillary: 223 mg/dL — ABNORMAL HIGH (ref 65–99)

## 2016-06-06 NOTE — Progress Notes (Signed)
Physical Therapy Treatment Patient Details Name: Donald Velez MRN: [id number] DOB: Jul 29, 1960 Today's Date: 06/06/2016    History of Present Illness Pt is 56 y/o male s/p elective R THA. PMH includes HTN, anxiety, DM, L THA, and  R knee arthroscopy.     PT Comments    Patient is making good progress with PT.  From a mobility standpoint anticipate patient will be ready for DC home when medically ready.    Follow Up Recommendations  Home health PT;Supervision/Assistance - 24 hour     Equipment Recommendations  Rolling walker with 5" wheels;3in1 (PT)    Recommendations for Other Services       Precautions / Restrictions Precautions Precautions: Anterior Hip Precaution Booklet Issued: Yes (comment) Restrictions Weight Bearing Restrictions: Yes RLE Weight Bearing: Weight bearing as tolerated    Mobility  Bed Mobility Overal bed mobility: Modified Independent             General bed mobility comments: increased time/effort  Transfers Overall transfer level: Needs assistance Equipment used: Rolling walker (2 wheeled) Transfers: Sit to/from Stand Sit to Stand: Min guard         General transfer comment: cues for safe hand placement  Ambulation/Gait Ambulation/Gait assistance: Supervision Ambulation Distance (Feet): 4 Feet Assistive device: Rolling walker (2 wheeled) Gait Pattern/deviations: Step-through pattern;Trunk flexed Gait velocity: Decreased   General Gait Details: cues for posture   Stairs Stairs: Yes   Stair Management: No rails;Backwards;With walker Number of Stairs: 1 General stair comments: cues for sequencing and technique; assist to stabilize RW  Wheelchair Mobility    Modified Rankin (Stroke Patients Only)       Balance Overall balance assessment: Needs assistance Sitting-balance support: No upper extremity supported;Feet supported Sitting balance-Leahy Scale: Good     Standing balance support: Single extremity supported;During  functional activity Standing balance-Leahy Scale: Fair                              Cognition Arousal/Alertness: Awake/alert Behavior During Therapy: WFL for tasks assessed/performed Overall Cognitive Status: Within Functional Limits for tasks assessed                                        Exercises Total Joint Exercises Quad Sets: AROM;Right;10 reps Short Arc Quad: AROM;AAROM;Right;10 reps Heel Slides: AAROM;Right;10 reps Hip ABduction/ADduction: AROM;Right;Supine;Standing (10 supine and 10 standing) Long Arc Quad: AROM;Right;10 reps Knee Flexion: AROM;Right;10 reps;Standing Marching in Standing: AROM;Right;10 reps;Standing    General Comments General comments (skin integrity, edema, etc.): mother present      Pertinent Vitals/Pain Pain Assessment: Faces Faces Pain Scale: Hurts little more Pain Location: R hip mainly with flexion therex Pain Descriptors / Indicators: Aching;Grimacing;Guarding Pain Intervention(s): Monitored during session;Limited activity within patient's tolerance;Premedicated before session;Repositioned    Home Living                      Prior Function            PT Goals (current goals can now be found in the care plan section) Acute Rehab PT Goals Patient Stated Goal: to return home  PT Goal Formulation: With patient Time For Goal Achievement: 06/12/16 Potential to Achieve Goals: Good Progress towards PT goals: Progressing toward goals    Frequency    7X/week      PT Plan Current plan remains  appropriate    Co-evaluation             End of Session Equipment Utilized During Treatment: Gait belt Activity Tolerance: Patient tolerated treatment well Patient left: with call bell/phone within reach;with family/visitor present;in bed Nurse Communication: Mobility status PT Visit Diagnosis: Other abnormalities of gait and mobility (R26.89);Pain Pain - Right/Left: Right Pain - part of body: Hip      Time: 1435-1455 PT Time Calculation (min) (ACUTE ONLY): 20 min  Charges:  $Gait Training: 8-22 mins $Therapeutic Exercise: 8-22 mins                    G Codes:       Earney Navy, PTA Pager: [phone]     Darliss Cheney 06/06/2016, 3:07 PM

## 2016-06-06 NOTE — Progress Notes (Signed)
Physical Therapy Treatment Patient Details Name: Donald Velez MRN: [id number] DOB: [birth year] Today's Date: 06/06/2016    History of Present Illness Pt is 56 y/o male s/p elective R THA. PMH includes HTN, anxiety, DM, L THA, and  R knee arthroscopy.     PT Comments    Patient progressing well toward mobility goals. HEP next session. Current plan remains appropriate.   Follow Up Recommendations  Home health PT;Supervision/Assistance - 24 hour     Equipment Recommendations  Rolling walker with 5" wheels;3in1 (PT)    Recommendations for Other Services       Precautions / Restrictions Precautions Precautions: Anterior Hip Precaution Booklet Issued: Yes (comment) Restrictions Weight Bearing Restrictions: Yes RLE Weight Bearing: Weight bearing as tolerated    Mobility  Bed Mobility               General bed mobility comments: pt sitting EOB upon arrival  Transfers Overall transfer level: Needs assistance Equipment used: Rolling walker (2 wheeled) Transfers: Sit to/from Stand Sit to Stand: Min guard         General transfer comment: cues for safe hand placement  Ambulation/Gait Ambulation/Gait assistance: Supervision Ambulation Distance (Feet): 150 Feet Assistive device: Rolling walker (2 wheeled) Gait Pattern/deviations: Decreased weight shift to right;Step-through pattern;Decreased stance time - right;Decreased step length - left;Trunk flexed Gait velocity: Decreased   General Gait Details: cues for posture, sequencing, and R heel strike   Stairs Stairs: Yes   Stair Management: No rails;Backwards;With walker Number of Stairs: 1 General stair comments: cues for sequencing and technique; assist to stabilize RW  Wheelchair Mobility    Modified Rankin (Stroke Patients Only)       Balance Overall balance assessment: Needs assistance Sitting-balance support: No upper extremity supported;Feet supported Sitting balance-Leahy Scale: Good      Standing balance support: Single extremity supported;During functional activity Standing balance-Leahy Scale: Fair                              Cognition Arousal/Alertness: Awake/alert Behavior During Therapy: WFL for tasks assessed/performed Overall Cognitive Status: Within Functional Limits for tasks assessed                                        Exercises      General Comments        Pertinent Vitals/Pain Pain Assessment: Faces Faces Pain Scale: Hurts even more Pain Location: R hip Pain Descriptors / Indicators: Aching Pain Intervention(s): Limited activity within patient's tolerance;Monitored during session;Premedicated before session;Repositioned;Patient requesting pain meds-RN notified    Home Living                      Prior Function            PT Goals (current goals can now be found in the care plan section) Acute Rehab PT Goals Patient Stated Goal: to return home  PT Goal Formulation: With patient Time For Goal Achievement: 06/12/16 Potential to Achieve Goals: Good Progress towards PT goals: Progressing toward goals    Frequency    7X/week      PT Plan Current plan remains appropriate    Co-evaluation             End of Session Equipment Utilized During Treatment: Gait belt Activity Tolerance: Patient tolerated treatment well Patient left: in chair;with  call bell/phone within reach;with family/visitor present Nurse Communication: Mobility status PT Visit Diagnosis: Other abnormalities of gait and mobility (R26.89);Pain Pain - Right/Left: Right Pain - part of body: Hip     Time: 1032-1050 PT Time Calculation (min) (ACUTE ONLY): 18 min  Charges:  $Gait Training: 8-22 mins                    G Codes:       Earney Navy, PTA Pager: [phone]     Darliss Cheney 06/06/2016, 1:36 PM

## 2016-06-06 NOTE — Care Management Note (Signed)
Case Management Note  Patient Details  Name: Donald Velez MRN: [id number] Date of Birth: 1961/01/12  Subjective/Objective:  56 yr old gentleman s/p right total hip arthroplasty, anterior approach.         Action/Plan: Patient was preoperatively setup with [facility], no changes. He will have support of family at discharge. DME has been delivered to his room.    Expected Discharge Date:  06/06/16               Expected Discharge Plan:  [place]  In-House Referral:     Discharge planning Services  CM Consult  Post Acute Care Choice:  Durable Medical Equipment, Home Health Choice offered to:  Patient  DME Arranged:  3-N-1, Walker rolling DME Agency:  [place]:  PT [place]:  [facility] (formerly [facility])  Status of Service:  Completed, signed off  If discussed at [employer], dates discussed:    Additional Comments:  Ninfa Meeker, RN 06/06/2016, 1:50 PM

## 2016-06-06 NOTE — Progress Notes (Signed)
Subjective: 1 Day Post-Op Procedure(s) (LRB): TOTAL HIP ARTHROPLASTY ANTERIOR APPROACH (Right) Patient reports pain as mild.    Objective: Vital signs in last 24 hours: Temp:  [97.6 F (36.4 C)-99.8 F (37.7 C)] 99.8 F (37.7 C) (04/19 0512) Pulse Rate:  [81-117] 96 (04/19 0512) Resp:  [11-20] 15 (04/19 0512) BP: (118-131)/(68-87) 131/68 (04/19 0512) SpO2:  [91 %-100 %] 97 % (04/19 0512) Weight:  [122 kg (269 lb)] 122 kg (269 lb) (04/18 0906)  Intake/Output from previous day: 04/18 0701 - 04/19 0700 In: 3013.3 [P.O.:240; I.V.:2173.3; IV Piggyback:600] Out: 3075 [Urine:1575; Blood:1500] Intake/Output this shift: No intake/output data recorded.   Recent Labs  06/06/16 0520  HGB 10.6*    Recent Labs  06/06/16 0520  WBC 8.6  RBC 3.67*  HCT 31.4*  PLT 261    Recent Labs  06/06/16 0520  NA 131*  K 4.0  CL 93*  CO2 25  BUN 14  CREATININE 1.07  GLUCOSE 231*  CALCIUM 8.9   No results for input(s): LABPT, INR in the last 72 hours.  Neurologically intact Neurovascular intact Sensation intact distally Intact pulses distally Dorsiflexion/Plantar flexion intact Incision: scant drainage No cellulitis present Compartment soft  Assessment/Plan: 1 Day Post-Op Procedure(s) (LRB): TOTAL HIP ARTHROPLASTY ANTERIOR APPROACH (Right) Advance diet Up with therapy D/C IV fluids Discharge home with home health after first second session of PT.  Ok to go after first session if he mobilizes well. WBAT RLE-anterior hip precautions ABLA-mild and stable Dry dressing change prn  Fannie Knee 06/06/2016, 8:51 AM

## 2016-06-07 NOTE — Op Note (Signed)
NAMEEmanuel, Donald Velez NO.:  MEDICAL RECORD NO.:  [id number]  LOCATION:                                 FACILITY:  PHYSICIAN:  [name], M.D.      DATE OF BIRTH:  DATE OF PROCEDURE:  06/05/2016 DATE OF DISCHARGE:                              OPERATIVE REPORT   PREOPERATIVE DIAGNOSES: 1. Right hip end-stage arthritis, primary, generalized. 2. Morbid obesity.  POSTOPERATIVE DIAGNOSIS: 1. Right hip end-stage arthritis, primary, generalized. 2. Morbid obesity.  PROCEDURE:  Direct anterior right total hip replacement Stryker prosthesis.  Press-fit 54 mm cup, screw fixation x2, a 36 mm internal diameter liner.  A press-fit #6 Accolate stem with a 36 -5 BIOLOX head.  SURGEON:  [name], M.D.  ASSISTANT:  Elmyra Ricks, PA, present throughout the entire case and necessary for timely completion of procedure.  ANESTHESIA:  General.  BLOOD LOSS:  500 mL.  BLOOD GIVEN:  None.  SPECIMENS:  None.  CULTURES:  None.  COMPLICATIONS:  None.  DRESSING:  Soft compressive.  DESCRIPTION OF PROCEDURE:  The patient was brought to the operating room and after adequate anesthesia had been obtained, placed on the Hana bed, prepped and draped in usual sterile fashion.  Direct anterior approach along the longitudinal incision.  Tensor exposed, retracted.  The deep tissue, which was very vascular, all excised.  The entire process from start to finish made more difficult because of how muscled he was, as well as coupled with morbid obesity.  Able to expose the capsule, put retractors in place, excise the capsule.  Femoral neck cut 1 fingerbreadth above the lesser trochanter.  Acetabulum exposed.  Brought up to good bleeding bone.  Fitted with a 54 mm cup at 40 degrees of abduction, slight anteversion confirmed fluoroscopic guidance.  Good capturing and fixation augmented with 2 screws through the cup.  Liner inserted.  With the Hana bed, I  positioned the femur.  He was able to broaches and fitted with a #6 stem.  After trials, I initially was planning on the utilizing a 0 head, but I could not get this extensively reduced.  I had a good down to a -5 head.  With that head reduction and very acceptable leg lengths confirmed visually and fluoroscopically as well as very stable hip.  Wound irrigated.  Soft tissue was injected with Exparel.  Fascia closed with Vicryl.  Subcutaneous and subcuticular closure.  Sterile compressive dressing applied.  Anesthesia reversed. Brought to the recovery room.  Tolerated the surgery well.  No complications.     [name], M.D.     DFM/MEDQ  D:  06/06/2016  T:  06/06/2016  Job:  [id number]

## 2016-06-08 LAB — POCT I-STAT 4, (NA,K, GLUC, HGB,HCT)
Glucose, Bld: 242 mg/dL — ABNORMAL HIGH (ref 65–99)
HEMATOCRIT: 33 % — AB (ref 39.0–52.0)
Hemoglobin: 11.2 g/dL — ABNORMAL LOW (ref 13.0–17.0)
POTASSIUM: 4.1 mmol/L (ref 3.5–5.1)
SODIUM: 135 mmol/L (ref 135–145)

## 2016-12-16 ENCOUNTER — Encounter: Payer: Self-pay | Admitting: Gastroenterology

## 2017-08-19 ENCOUNTER — Encounter: Payer: Self-pay | Admitting: Gastroenterology

## 2017-10-16 ENCOUNTER — Other Ambulatory Visit: Payer: Self-pay

## 2017-10-16 ENCOUNTER — Encounter: Payer: Self-pay | Admitting: Gastroenterology

## 2017-10-16 ENCOUNTER — Ambulatory Visit (AMBULATORY_SURGERY_CENTER): Payer: Self-pay | Admitting: *Deleted

## 2017-10-16 VITALS — Ht 70.0 in | Wt 296.8 lb

## 2017-10-16 DIAGNOSIS — Z86010 Personal history of colonic polyps: Secondary | ICD-10-CM

## 2017-10-16 MED ORDER — SUPREP BOWEL PREP KIT 17.5-3.13-1.6 GM/177ML PO SOLN: 1.0000 | Freq: Once | ORAL | 0 refills | Status: AC

## 2017-10-16 NOTE — Progress Notes (Signed)
Patient denies any allergies to egg or soy products. Patient states "slow to wake up" from aesthesia/sedation.  Patient denies oxygen use at home and denies diet medications. Patient denies information on colonoscopy.

## 2017-10-30 ENCOUNTER — Ambulatory Visit (AMBULATORY_SURGERY_CENTER): Payer: BC Managed Care – PPO | Admitting: Gastroenterology

## 2017-10-30 ENCOUNTER — Encounter: Payer: Self-pay | Admitting: Gastroenterology

## 2017-10-30 VITALS — BP 110/70 | HR 80 | Temp 98.4°F | Resp 18 | Ht 70.0 in | Wt 296.0 lb

## 2017-10-30 DIAGNOSIS — Z86010 Personal history of colonic polyps: Secondary | ICD-10-CM

## 2017-10-30 DIAGNOSIS — K635 Polyp of colon: Secondary | ICD-10-CM

## 2017-10-30 DIAGNOSIS — D125 Benign neoplasm of sigmoid colon: Secondary | ICD-10-CM

## 2017-10-30 MED ORDER — SODIUM CHLORIDE 0.9 % IV SOLN: 500.0000 mL | Freq: Once | INTRAVENOUS | Status: DC

## 2017-10-30 NOTE — Progress Notes (Signed)
Pt's states no medical or surgical changes since previsit or office visit. 

## 2017-10-30 NOTE — Op Note (Signed)
[place] Patient Name: Donald Velez Procedure Date: 10/30/2017 7:58 AM MRN: [id number] Endoscopist: Ladene Artist , MD Age: 57 Referring MD:  Date of Birth: 1960-03-05 Gender: Male Account #: [id number] Procedure:                Colonoscopy Indications:              Surveillance: Personal history of adenomatous                            polyps on last colonoscopy 5 years ago Medicines:                Monitored Anesthesia Care Procedure:                Pre-Anesthesia Assessment:                           - Prior to the procedure, a History and Physical                            was performed, and patient medications and                            allergies were reviewed. The patient's tolerance of                            previous anesthesia was also reviewed. The risks                            and benefits of the procedure and the sedation                            options and risks were discussed with the patient.                            All questions were answered, and informed consent                            was obtained. Prior Anticoagulants: The patient has                            taken no previous anticoagulant or antiplatelet                            agents. ASA Grade Assessment: II - A patient with                            mild systemic disease. After reviewing the risks                            and benefits, the patient was deemed in                            satisfactory condition to undergo the procedure.  After obtaining informed consent, the colonoscope                            was passed under direct vision. Throughout the                            procedure, the patient's blood pressure, pulse, and                            oxygen saturations were monitored continuously. The                            Colonoscope was introduced through the anus and                            advanced to the the cecum,  identified by                            appendiceal orifice and ileocecal valve. The                            ileocecal valve, appendiceal orifice, and rectum                            were photographed. The quality of the bowel                            preparation was good. The colonoscopy was performed                            without difficulty. The patient tolerated the                            procedure well. Scope In: 8:02:30 AM Scope Out: 8:17:52 AM Scope Withdrawal Time: 0 hours 12 minutes 44 seconds  Total Procedure Duration: 0 hours 15 minutes 22 seconds  Findings:                 The perianal and digital rectal examinations were                            normal.                           Two sessile polyps were found in the sigmoid colon.                            The polyps were 7 to 8 mm in size. These polyps                            were removed with a cold snare. Resection and                            retrieval were complete.  Multiple medium-mouthed diverticula were found in                            the left colon. There was no evidence of                            diverticular bleeding.                           Internal hemorrhoids were found during                            retroflexion. The hemorrhoids were small and Grade                            I (internal hemorrhoids that do not prolapse).                           The exam was otherwise without abnormality on                            direct and retroflexion views. Complications:            No immediate complications. Estimated blood loss:                            None. Estimated Blood Loss:     Estimated blood loss: none. Impression:               - Two 7 to 8 mm polyps in the sigmoid colon,                            removed with a cold snare. Resected and retrieved.                           - Moderate diverticulosis in the left colon. There                             was no evidence of diverticular bleeding.                           - Internal hemorrhoids.                           - The examination was otherwise normal on direct                            and retroflexion views. Recommendation:           - Repeat colonoscopy in 5 years for surveillance.                           - Patient has a contact number available for                            emergencies. The signs and symptoms of potential  delayed complications were discussed with the                            patient. Return to normal activities tomorrow.                            Written discharge instructions were provided to the                            patient.                           - High fiber diet.                           - Continue present medications.                           - Await pathology results. Ladene Artist, MD 10/30/2017 8:23:51 AM This report has been signed electronically.

## 2017-10-30 NOTE — Progress Notes (Signed)
Report given to PACU, vss 

## 2017-10-30 NOTE — Progress Notes (Signed)
Called to room to assist during endoscopic procedure.  Patient ID and intended procedure confirmed with present staff. Received instructions for my participation in the procedure from the performing physician.  

## 2017-10-31 ENCOUNTER — Telehealth: Payer: Self-pay

## 2017-10-31 NOTE — Telephone Encounter (Signed)
Left message

## 2017-10-31 NOTE — Telephone Encounter (Signed)
Pt call back states he is doing fine.

## 2017-11-17 ENCOUNTER — Encounter: Payer: Self-pay | Admitting: Gastroenterology

## 2018-10-27 ENCOUNTER — Ambulatory Visit: Payer: Self-pay

## 2018-10-27 ENCOUNTER — Other Ambulatory Visit: Payer: Self-pay

## 2018-10-27 DIAGNOSIS — Z021 Encounter for pre-employment examination: Secondary | ICD-10-CM

## 2018-10-27 NOTE — Progress Notes (Signed)
UDS done today, chain of custody sent to [facility] with sample

## 2019-02-11 IMAGING — RF DG C-ARM 61-120 MIN
1 series · 3 of 3 positions shown · non-contrast
Comparison: None.

CLINICAL DATA: Right hip replacement

EXAM:
DG C-ARM 61-120 MIN; OPERATIVE RIGHT HIP WITH PELVIS

[Series 1: run · 3 of 3 slices shown]
[im 1/3]
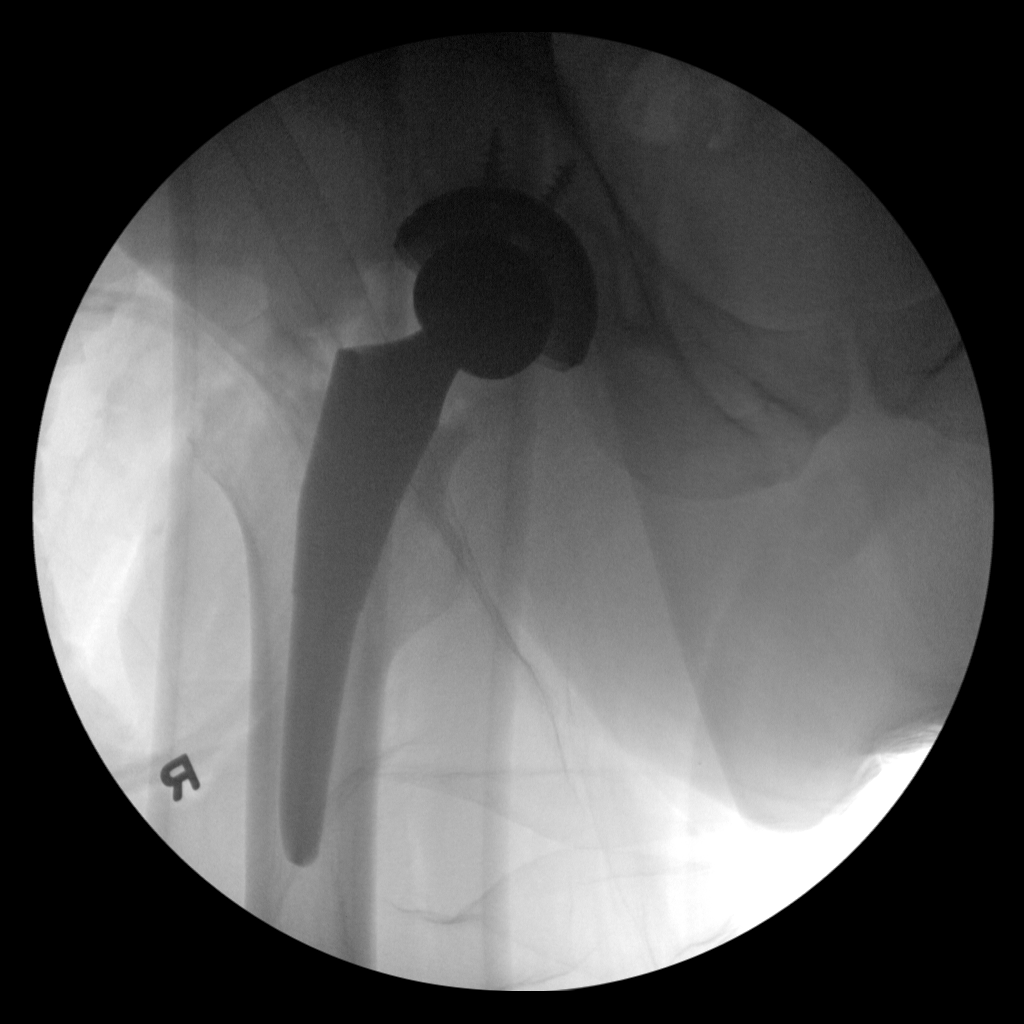
[im 2/3]
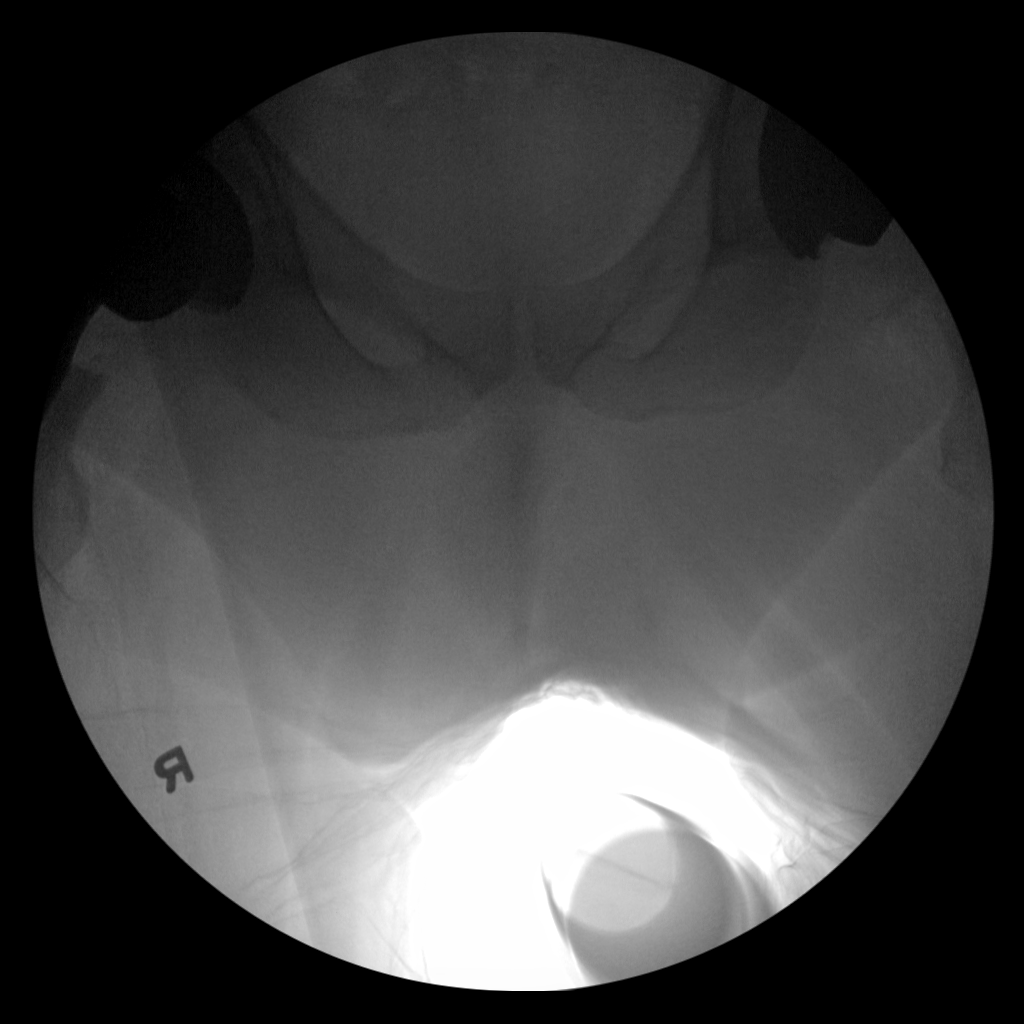
[im 3/3]
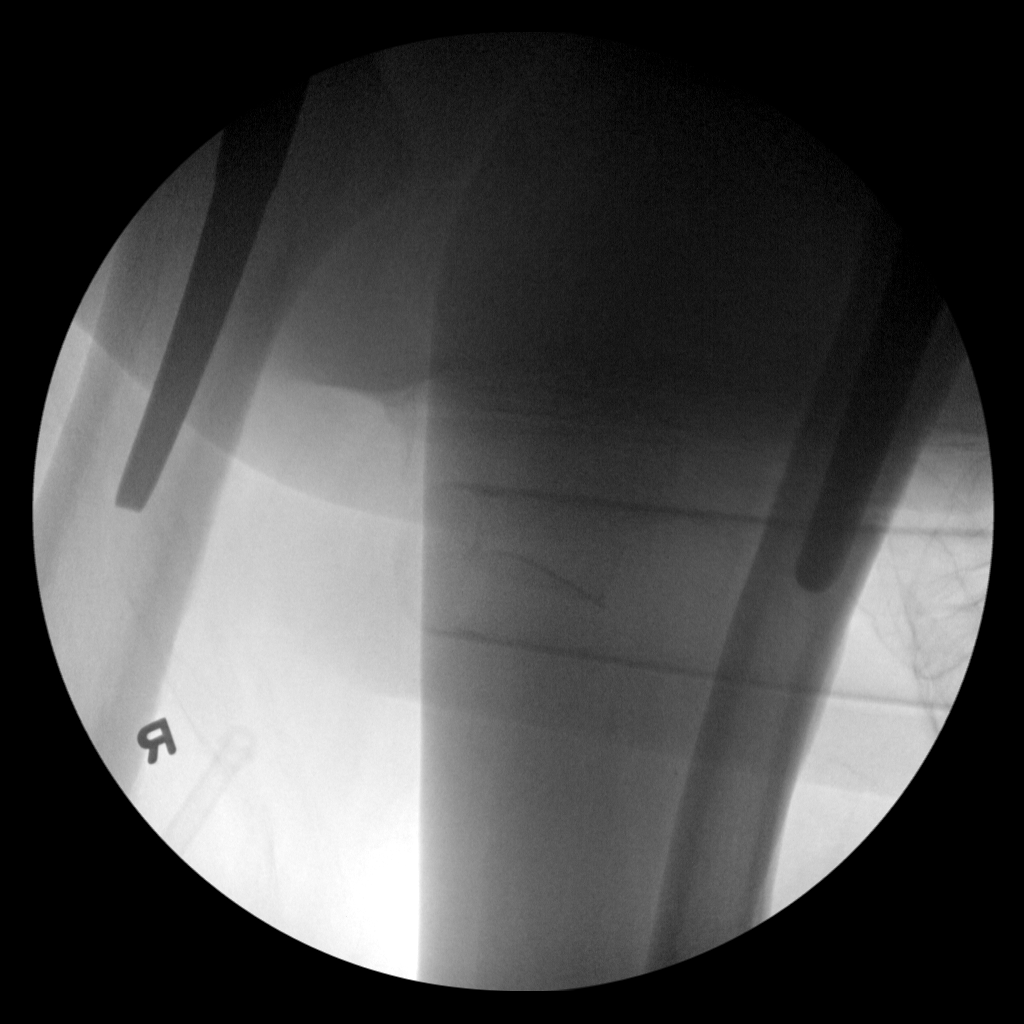

[3 of 3 positions shown; findings below may reference images not displayed]

FLUOROSCOPY TIME:  Radiation Exposure Index (as provided by the
fluoroscopic device): Not available

If the device does not provide the exposure index:

Fluoroscopy Time:  47 seconds

Number of Acquired Images:  3
FINDINGS: Right hip replacement is noted. No acute bony abnormality is seen.
No soft tissue changes are noted.
IMPRESSION: Status post right hip replacement.

## 2021-06-04 ENCOUNTER — Other Ambulatory Visit: Payer: Self-pay

## 2021-06-04 NOTE — Progress Notes (Signed)
Pt completed pre-employment UDS. Pending results for confirmation./CL,RMA ?

## 2022-12-11 ENCOUNTER — Other Ambulatory Visit: Payer: Self-pay

## 2022-12-11 DIAGNOSIS — R059 Cough, unspecified: Secondary | ICD-10-CM

## 2022-12-11 DIAGNOSIS — R0981 Nasal congestion: Secondary | ICD-10-CM

## 2022-12-11 LAB — POC COVID19 BINAXNOW: SARS Coronavirus 2 Ag: NEGATIVE

## 2022-12-11 NOTE — Progress Notes (Signed)
Stated he was sick about 4 weeks ago, but over the weekend started coughing.  Unsure of fever as he doesn't own a thermometer.  Denies dyspnea and taking PO well.  Respirations quiet and unlabored with parking lot covid test at request.  Encouraged to practice deep breathing and will check with pharmacist about cough medicine compatible with his current meds for during the night.  Test results negative reported to pt and provider.  Encouraged to call back for worsening symptoms or need for provider visit.

## 2023-05-02 ENCOUNTER — Encounter: Payer: Self-pay | Admitting: Gastroenterology

## 2023-05-02 ENCOUNTER — Ambulatory Visit: Payer: Self-pay | Admitting: Gastroenterology

## 2023-05-02 VITALS — BP 110/72 | HR 78 | Ht 72.0 in | Wt 288.1 lb

## 2023-05-02 DIAGNOSIS — Z8719 Personal history of other diseases of the digestive system: Secondary | ICD-10-CM | POA: Diagnosis not present

## 2023-05-02 DIAGNOSIS — K219 Gastro-esophageal reflux disease without esophagitis: Secondary | ICD-10-CM

## 2023-05-02 DIAGNOSIS — Z860100 Personal history of colon polyps, unspecified: Secondary | ICD-10-CM

## 2023-05-02 DIAGNOSIS — Z860102 Personal history of hyperplastic colon polyps: Secondary | ICD-10-CM

## 2023-05-02 MED ORDER — SUFLAVE 178.7 G PO SOLR: 1.0000 | Freq: Once | ORAL | 0 refills | Status: AC

## 2023-05-02 NOTE — Progress Notes (Signed)
 Chief Complaint: Recall colonoscopy Primary GI MD: Unassigned (Dr. Russella Dar)  HPI: 63 year old male history of GERD, diabetes, arthritis, presents for evaluation of recall colonoscopy.  Previous patient of [facility] last seen in 2020.  At the time of his last GI appointment with [facility] he was seen for 60-month history of progressive dysphagia and weight loss.  He was set up for EGD and reportedly had dilation with improvement in symptoms.Donald Velez  He was also put on antacid regimen. He feels better now and has not experienced further issues with swallowing.  Today he denies any GI symptoms and states he is here to re-establish to get his repeat colonoscopy.     PREVIOUS GI WORKUP   Colonoscopy 10/2017 for history of colon polyps - Two 7 to 8 mm polyps (hyperplastic) in the sigmoid colon, removed with a cold snare. Resected and retrieved.  - Moderate diverticulosis in the left colon. There was no evidence of diverticular bleeding.  - Internal hemorrhoids.  - The examination was otherwise normal on direct and retroflexion views. - Repeat 10/2022  Past Medical History:  Diagnosis Date   Arthritis    lower back, knee   Complication of anesthesia    ACL repair 2006  hard time waking up.  late 2 surgeries, no problems   Depression    Diabetes mellitus    dx 2010  type 2   GERD (gastroesophageal reflux disease)    occasional   Hyperlipidemia    Hypertension    Low testosterone    Panic attacks    PONV (postoperative nausea and vomiting)    1 time back in the 1980's   RLS (restless legs syndrome)    dx 2004   Seizures (HCC)    happened while in high school.   Not on any meds    Past Surgical History:  Procedure Laterality Date   COLONOSCOPY  2008; 2013   by Dr.[facility]; [place]-polyps   HERNIA REPAIR     belly button   HIP PINNING Left 1980; 1982   JOINT REPLACEMENT     KNEE ARTHROSCOPY Right 2000   KNEE ARTHROSCOPY W/ ACL RECONSTRUCTION Right 2006    TONSILLECTOMY     as child   TOTAL HIP ARTHROPLASTY Left 10/03/2010   TOTAL HIP ARTHROPLASTY Right 06/05/2016   TOTAL HIP ARTHROPLASTY Right 06/05/2016   Procedure: TOTAL HIP ARTHROPLASTY ANTERIOR APPROACH;  Surgeon: Loreta Ave, MD;  Location: [facility] OR;  Service: Orthopedics;  Laterality: Right;    Current Outpatient Medications  Medication Sig Dispense Refill   ALPRAZolam (XANAX) 0.5 MG tablet Take 1 tablet by mouth Daily.     Cyanocobalamin (VITAMIN B-12) 2500 MCG SUBL Place under the tongue.     Dulaglutide 0.75 MG/0.5ML SOPN Inject into the skin.     DULoxetine (CYMBALTA) 60 MG capsule Take 60 mg by mouth daily.     glimepiride (AMARYL) 2 MG tablet Take 1 tablet by mouth 2 (two) times daily.      glipiZIDE (GLUCOTROL XL) 10 MG 24 hr tablet Take 10 mg by mouth daily.     lisinopril-hydrochlorothiazide (PRINZIDE,ZESTORETIC) 20-25 MG tablet Take 1 tablet by mouth daily.     metFORMIN (GLUCOPHAGE) 500 MG tablet Take 500 mg by mouth 2 (two) times daily with a meal.     simvastatin (ZOCOR) 20 MG tablet Take 20 mg by mouth daily.     SUFLAVE 178.7 g SOLR Take 1 kit by mouth once for 1 dose. 1 each 0  DUEXIS 800-26.6 MG TABS TAKE ONE TABLET BY MOUTH EVERY 8 HOURS WITH FOOD FOR PAIN AND swelling (Patient not taking: Reported on 05/02/2023)  5   No current facility-administered medications for this visit.    Allergies as of 05/02/2023 - Review Complete 05/02/2023  Allergen Reaction Noted   No known allergies  06/04/2016    Family History  Problem Relation Age of Onset   Prostate cancer Father 21   Colon cancer Neg Hx    Colon polyps Neg Hx    Rectal cancer Neg Hx    Stomach cancer Neg Hx     Social History   Socioeconomic History   Marital status: Single    Spouse name: Not on file   Number of children: Not on file   Years of education: Not on file   Highest education level: Not on file  Occupational History   Not on file  Tobacco Use   Smoking status: Never   Smokeless  tobacco: Never  Vaping Use   Vaping status: Never Used  Substance and Sexual Activity   Alcohol use: No   Drug use: No   Sexual activity: Not on file  Other Topics Concern   Not on file  Social History Narrative   Not on file   Social Drivers of Health   Financial Resource Strain: Low Risk  (03/11/2023)   Received from [facility]   Overall Financial Resource Strain (CARDIA)    Difficulty of Paying Living Expenses: Not hard at all  Food Insecurity: No Food Insecurity (03/11/2023)   Received from [facility]   Hunger Vital Sign    Worried About Running Out of Food in the Last Year: Never true    Ran Out of Food in the Last Year: Never true  Transportation Needs: No Transportation Needs (03/11/2023)   Received from [facility] - Transportation    In the past 12 months, has lack of transportation kept you from medical appointments or from getting medications?: No    Lack of Transportation (Non-Medical): No  Physical Activity: Not on file  Stress: Not on file  Social Connections: Not on file  Intimate Partner Violence: Not on file    Review of Systems:    Constitutional: No weight loss, fever, chills, weakness or fatigue HEENT: Eyes: No change in vision               Ears, Nose, Throat:  No change in hearing or congestion Skin: No rash or itching Cardiovascular: No chest pain, chest pressure or palpitations   Respiratory: No SOB or cough Gastrointestinal: See HPI and otherwise negative Genitourinary: No dysuria or change in urinary frequency Neurological: No headache, dizziness or syncope Musculoskeletal: No new muscle or joint pain Hematologic: No bleeding or bruising Psychiatric: No history of depression or anxiety    Physical Exam:  Vital signs: BP 110/72   Pulse 78   Ht 6' (1.829 m)   Wt 288 lb 2 oz (130.7 kg)   SpO2 95%   BMI 39.08 kg/m   Constitutional: NAD, Well developed, Well nourished, alert  and cooperative Head:  Normocephalic and atraumatic. Eyes:   PEERL, EOMI. No icterus. Conjunctiva pink. Respiratory: Respirations even and unlabored. Lungs clear to auscultation bilaterally.   No wheezes, crackles, or rhonchi.  Cardiovascular:  Regular rate and rhythm. No peripheral edema, cyanosis or pallor.  Gastrointestinal:  Soft, nondistended, nontender. No rebound or guarding. Normal bowel sounds. No appreciable masses  or hepatomegaly. Rectal:  Not performed.  Msk:  Symmetrical without gross deformities. Without edema, no deformity or joint abnormality.  Neurologic:  Alert and  oriented x4;  grossly normal neurologically.  Skin:   Dry and intact without significant lesions or rashes. Psychiatric: Oriented to person, place and time. Demonstrates good judgement and reason without abnormal affect or behaviors.   RELEVANT LABS AND IMAGING: CBC    Component Value Date/Time   WBC 8.6 06/06/2016 0520   RBC 3.67 (L) 06/06/2016 0520   HGB 10.6 (L) 06/06/2016 0520   HCT 31.4 (L) 06/06/2016 0520   PLT 261 06/06/2016 0520   MCV 85.6 06/06/2016 0520   MCH 28.9 06/06/2016 0520   MCHC 33.8 06/06/2016 0520   RDW 12.5 06/06/2016 0520   LYMPHSABS 1.2 10/06/2010 0630   MONOABS 0.7 10/06/2010 0630   EOSABS 0.1 10/06/2010 0630   BASOSABS 0.1 10/06/2010 0630    CMP     Component Value Date/Time   NA 131 (L) 06/06/2016 0520   K 4.0 06/06/2016 0520   CL 93 (L) 06/06/2016 0520   CO2 25 06/06/2016 0520   GLUCOSE 231 (H) 06/06/2016 0520   BUN 14 06/06/2016 0520   CREATININE 1.07 06/06/2016 0520   CALCIUM 8.9 06/06/2016 0520   PROT 7.1 05/23/2016 1056   ALBUMIN 3.9 05/23/2016 1056   AST 25 05/23/2016 1056   ALT 24 05/23/2016 1056   ALKPHOS 99 05/23/2016 1056   BILITOT 0.7 05/23/2016 1056   GFRNONAA >60 06/06/2016 0520   GFRAA >60 06/06/2016 0520     Assessment/Plan:   History of colon polyps Last colonoscopy in 2019 with 2 hyperplastic polyps but due to his previous history of  tubular adenomas he was set on a 5-year recall.  No GI issues at this time.  He would like a colonoscopy - Schedule colonoscopy - I thoroughly discussed the procedure with the patient (at bedside) to include nature of the procedure, alternatives, benefits, and risks (including but not limited to bleeding, infection, perforation, anesthesia/cardiac pulmonary complications).  Patient verbalized understanding and gave verbal consent to proceed with procedure.  History of dysphagia Seen by [facility] in 2020 and underwent EGD with dilation per report.  We will obtain this record.  No further dysphagia since and no upper GI symptoms at this time  Assigned to Dr. Tomasa Rand today  Boone Master, PA-C [facility] 05/02/2023, 10:13 AM  Cc: Danella Penton, MD

## 2023-05-02 NOTE — Patient Instructions (Signed)
 You have been scheduled for a colonoscopy. Please follow written instructions given to you at your visit today.   If you use inhalers (even only as needed), please bring them with you on the day of your procedure.  DO NOT TAKE 7 DAYS PRIOR TO TEST- Trulicity (dulaglutide) Ozempic, Wegovy (semaglutide) Mounjaro (tirzepatide) Bydureon Bcise (exanatide extended release)  DO NOT TAKE 1 DAY PRIOR TO YOUR TEST Rybelsus (semaglutide) Adlyxin (lixisenatide) Victoza (liraglutide) Byetta (exanatide) ___________________________________________________________________________  Bonita Quin will receive your bowel preparation through [facility], which ensures the lowest copay and home delivery, with outreach via text or call from an [phone] number. Please respond promptly to avoid rescheduling of your procedure. If you are interested in alternative options or have any questions regarding your prep, please contact them at [phone] ____________________________________________________________________________  Your Provider Has Sent Your Bowel Prep Regimen To [facility]   [facility] will contact you to verify your information and collect your copay, if applicable. Enjoy the comfort of your home while your prescription is mailed to you, FREE of any shipping charges.   [facility] accepts all major insurance benefits and applies discounts & coupons.  Have additional questions?   Chat: [email or web address] Call: [phone] Email: [email or web address] NCPDP: [id number]  How will [facility] contact you?  With a Welcome phone call,  a Welcome text and a checkout link in text form.  Texts you receive from [phone] Are NOT Spam.  *To set up delivery, you must complete the checkout process via link or speak to one of the patient care representatives. If [facility] is unable to reach you, your prescription may be delayed.  To avoid long hold times on the phone, you may also utilize the secure chat  feature on the [facility] website to request that they call you back for transaction completion or to expedite your concerns.  Due to recent changes in healthcare laws, you may see the results of your imaging and laboratory studies on MyChart before your provider has had a chance to review them.  We understand that in some cases there may be results that are confusing or concerning to you. Not all laboratory results come back in the same time frame and the provider may be waiting for multiple results in order to interpret others.  Please give [place] 48 hours in order for your provider to thoroughly review all the results before contacting the office for clarification of your results.   Thank you for trusting me with your gastrointestinal care!   Boone Master, PA

## 2023-05-08 NOTE — Progress Notes (Signed)
 Agree with the assessment and plan as outlined by Boone Master, PA-C.  Based on my review of his records, he had hyperplastic polyps removed in 2019 and 2 hyperplastic polyps removed in 2013.  He had 3 polyps removed in 2009 as well, but the pathology results from these are not available.  Presumably, at least 1 of these most of been adenomatous.  Based on current guidelines, he could go 7 to 10 years from his next colonoscopy.  However, okay to proceed with 5-year interval given uncertainty of pathology of polyps in 2009.  If no adenomas found on this colonoscopy, patient should resume 7-10 year interval.

## 2023-05-23 ENCOUNTER — Encounter: Payer: Self-pay | Admitting: Gastroenterology

## 2023-05-30 ENCOUNTER — Ambulatory Visit: Admitting: Gastroenterology

## 2023-05-30 ENCOUNTER — Encounter: Payer: Self-pay | Admitting: Gastroenterology

## 2023-05-30 VITALS — BP 109/70 | HR 74 | Temp 97.2°F | Resp 12 | Ht 72.0 in | Wt 288.0 lb

## 2023-05-30 DIAGNOSIS — Z860100 Personal history of colon polyps, unspecified: Secondary | ICD-10-CM

## 2023-05-30 DIAGNOSIS — D128 Benign neoplasm of rectum: Secondary | ICD-10-CM

## 2023-05-30 DIAGNOSIS — D175 Benign lipomatous neoplasm of intra-abdominal organs: Secondary | ICD-10-CM

## 2023-05-30 DIAGNOSIS — K5730 Diverticulosis of large intestine without perforation or abscess without bleeding: Secondary | ICD-10-CM

## 2023-05-30 DIAGNOSIS — D120 Benign neoplasm of cecum: Secondary | ICD-10-CM

## 2023-05-30 DIAGNOSIS — Z1211 Encounter for screening for malignant neoplasm of colon: Secondary | ICD-10-CM

## 2023-05-30 MED ORDER — SODIUM CHLORIDE 0.9 % IV SOLN: 500.0000 mL | Freq: Once | INTRAVENOUS | Status: DC

## 2023-05-30 NOTE — Progress Notes (Signed)
 Called to room to assist during endoscopic procedure.  Patient ID and intended procedure confirmed with present staff. Received instructions for my participation in the procedure from the performing physician.

## 2023-05-30 NOTE — Progress Notes (Signed)
 History and Physical Interval Note:  05/30/2023 2:27 PM  Donald Velez  has presented today for endoscopic procedure(s), with the diagnosis of  Encounter Diagnosis  Name Primary?   History of colonic polyps Yes  .  The various methods of evaluation and treatment have been discussed with the patient and/or family. After consideration of risks, benefits and other options for treatment, the patient has consented to  the endoscopic procedure(s).   The patient's history has been reviewed, patient examined, no change in status, stable for endoscopic procedure(s).  I have reviewed the patient's chart and labs.  Questions were answered to the patient's satisfaction.     Keshia Weare E. Tomasa Rand, MD [facility]

## 2023-05-30 NOTE — Op Note (Signed)
 [facility] Patient Name: Donald Velez Procedure Date: 05/30/2023 2:24 PM MRN: [id number] Endoscopist: Lorin Picket E. Tomasa Rand , MD, [id number] Age: 63 Referring MD:  Date of Birth: 1960-08-31 Gender: Male Account #: [id number] Procedure:                Colonoscopy Indications:              Surveillance: Personal history of adenomatous                            polyps on last colonoscopy > 5 years ago Medicines:                Monitored Anesthesia Care Procedure:                Pre-Anesthesia Assessment:                           - Prior to the procedure, a History and Physical                            was performed, and patient medications and                            allergies were reviewed. The patient's tolerance of                            previous anesthesia was also reviewed. The risks                            and benefits of the procedure and the sedation                            options and risks were discussed with the patient.                            All questions were answered, and informed consent                            was obtained. Prior Anticoagulants: The patient has                            taken no anticoagulant or antiplatelet agents. ASA                            Grade Assessment: II - A patient with mild systemic                            disease. After reviewing the risks and benefits,                            the patient was deemed in satisfactory condition to                            undergo the procedure.  After obtaining informed consent, the colonoscope                            was passed under direct vision. Throughout the                            procedure, the patient's blood pressure, pulse, and                            oxygen saturations were monitored continuously. The                            Olympus Scope [id number] was introduced through the                            anus and advanced  to the the cecum, identified by                            appendiceal orifice and ileocecal valve. The                            colonoscopy was somewhat difficult due to a                            redundant colon and significant looping. Successful                            completion of the procedure was aided by using                            manual pressure. The patient tolerated the                            procedure well. The quality of the bowel                            preparation was adequate. The ileocecal valve,                            appendiceal orifice, and rectum were photographed.                            The bowel preparation used was SUFLAVE via split                            dose instruction. Scope In: 2:35:49 PM Scope Out: 3:01:37 PM Scope Withdrawal Time: 0 hours 20 minutes 56 seconds  Total Procedure Duration: 0 hours 25 minutes 48 seconds  Findings:                 The perianal and digital rectal examinations were                            normal. Pertinent negatives include normal  sphincter tone and no palpable rectal lesions.                           A 2 mm polyp was found in the cecum. The polyp was                            sessile. The polyp was removed with a cold snare.                            Resection and retrieval were complete. Estimated                            blood loss was minimal.                           A 2 mm polyp was found in the rectum. The polyp was                            sessile. The polyp was removed with a cold snare.                            Resection and retrieval were complete. Estimated                            blood loss was minimal.                           Multiple medium-mouthed and small-mouthed                            diverticula were found in the sigmoid colon and                            descending colon.                           There was a medium-sized  lipoma, 20 mm in diameter,                            in the transverse colon.                           The exam was otherwise normal throughout the                            examined colon.                           The retroflexed view of the distal rectum and anal                            verge was normal and showed no anal or rectal  abnormalities. Complications:            No immediate complications. Estimated Blood Loss:     Estimated blood loss was minimal. Impression:               - One 2 mm polyp in the cecum, removed with a cold                            snare. Resected and retrieved.                           - One 2 mm polyp in the rectum, removed with a cold                            snare. Resected and retrieved.                           - Mild diverticulosis in the sigmoid colon and in                            the descending colon.                           - Medium-sized lipoma in the transverse colon.                           - The distal rectum and anal verge are normal on                            retroflexion view. Recommendation:           - Patient has a contact number available for                            emergencies. The signs and symptoms of potential                            delayed complications were discussed with the                            patient. Return to normal activities tomorrow.                            Written discharge instructions were provided to the                            patient.                           - Resume previous diet.                           - Continue present medications.                           - Await pathology results.                           -  Repeat colonoscopy (date not yet determined) for                            surveillance based on pathology results. [name] E. Tomasa Rand, MD 05/30/2023 3:06:48 PM This report has been signed electronically.

## 2023-05-30 NOTE — Progress Notes (Signed)
 Report to PACU, RN, vss, BBS= Clear.

## 2023-05-30 NOTE — Progress Notes (Signed)
Vitals-CW  Pt's states no medical or surgical changes since previsit or office visit. 

## 2023-05-30 NOTE — Patient Instructions (Addendum)
 Handouts Provided:  Polyps and Diverticulosis  YOU HAD AN ENDOSCOPIC PROCEDURE TODAY AT THE [facility]:   Refer to the procedure report that was given to you for any specific questions about what was found during the examination.  If the procedure report does not answer your questions, please call your gastroenterologist to clarify.  If you requested that your care partner not be given the details of your procedure findings, then the procedure report has been included in a sealed envelope for you to review at your convenience later.  YOU SHOULD EXPECT: Some feelings of bloating in the abdomen. Passage of more gas than usual.  Walking can help get rid of the air that was put into your GI tract during the procedure and reduce the bloating. If you had a lower endoscopy (such as a colonoscopy or flexible sigmoidoscopy) you may notice spotting of blood in your stool or on the toilet paper. If you underwent a bowel prep for your procedure, you may not have a normal bowel movement for a few days.  Please Note:  You might notice some irritation and congestion in your nose or some drainage.  This is from the oxygen used during your procedure.  There is no need for concern and it should clear up in a day or so.  SYMPTOMS TO REPORT IMMEDIATELY:  Following lower endoscopy (colonoscopy or flexible sigmoidoscopy):  Excessive amounts of blood in the stool  Significant tenderness or worsening of abdominal pains  Swelling of the abdomen that is new, acute  Fever of 100F or higher  For urgent or emergent issues, a gastroenterologist can be reached at any hour by calling [phone]. Do not use MyChart messaging for urgent concerns.    DIET:  We do recommend a small meal at first, but then you may proceed to your regular diet.  Drink plenty of fluids but you should avoid alcoholic beverages for 24 hours.  ACTIVITY:  You should plan to take it easy for the rest of today and you should NOT DRIVE  or use heavy machinery until tomorrow (because of the sedation medicines used during the test).    FOLLOW UP: Our staff will call the number listed on your records the next business day following your procedure.  We will call around 7:15- 8:00 am to check on you and address any questions or concerns that you may have regarding the information given to you following your procedure. If we do not reach you, we will leave a message.     If any biopsies were taken you will be contacted by phone or by letter within the next 1-3 weeks.  Please call us at [phone] if you have not heard about the biopsies in 3 weeks.    SIGNATURES/CONFIDENTIALITY: You and/or your care partner have signed paperwork which will be entered into your electronic medical record.  These signatures attest to the fact that that the information above on your After Visit Summary has been reviewed and is understood.  Full responsibility of the confidentiality of this discharge information lies with you and/or your care-partner.

## 2023-06-03 ENCOUNTER — Telehealth: Payer: Self-pay | Admitting: *Deleted

## 2023-06-03 NOTE — Telephone Encounter (Signed)
  Follow up Call-     05/30/2023    1:39 PM 05/30/2023    1:33 PM  Call back number  Post procedure Call Back phone  # [phone]   Permission to leave phone message  Yes     Patient questions:  Do you have a fever, pain , or abdominal swelling? No. Pain Score  0 *  Have you tolerated food without any problems? Yes.    Have you been able to return to your normal activities? Yes.    Do you have any questions about your discharge instructions: Diet   No. Medications  No. Follow up visit  No.  Do you have questions or concerns about your Care? No.  Actions: * If pain score is 4 or above: No action needed, pain <4.

## 2023-06-04 LAB — SURGICAL PATHOLOGY

## 2023-06-09 ENCOUNTER — Encounter: Payer: Self-pay | Admitting: Gastroenterology

## 2024-03-25 ENCOUNTER — Other Ambulatory Visit: Payer: Self-pay | Admitting: Internal Medicine

## 2024-03-25 DIAGNOSIS — M4807 Spinal stenosis, lumbosacral region: Secondary | ICD-10-CM

## 2024-03-28 ENCOUNTER — Ambulatory Visit: Admission: RE | Admit: 2024-03-28
